# Patient Record
Sex: Female | Born: 1945
Health system: Southern US, Community
[De-identification: ages and names within clinical notes are randomized; demographics above are authoritative.]

## PROBLEM LIST (undated history)

## (undated) DIAGNOSIS — I639 Cerebral infarction, unspecified: Secondary | ICD-10-CM

## (undated) DIAGNOSIS — K219 Gastro-esophageal reflux disease without esophagitis: Secondary | ICD-10-CM

## (undated) HISTORY — DX: Gastro-esophageal reflux disease without esophagitis: K21.9

## (undated) HISTORY — DX: Cerebral infarction, unspecified: I63.9

---

## 1982-06-04 HISTORY — PX: ABDOMINAL HYSTERECTOMY: SHX81

## 1999-06-12 ENCOUNTER — Ambulatory Visit (HOSPITAL_COMMUNITY): Admission: RE | Admit: 1999-06-12 | Discharge: 1999-06-12 | Payer: Self-pay | Admitting: Internal Medicine

## 1999-06-12 ENCOUNTER — Encounter: Payer: Self-pay | Admitting: Internal Medicine

## 1999-07-15 ENCOUNTER — Emergency Department (HOSPITAL_COMMUNITY): Admission: EM | Admit: 1999-07-15 | Discharge: 1999-07-15 | Payer: Self-pay | Admitting: Emergency Medicine

## 1999-08-02 ENCOUNTER — Emergency Department (HOSPITAL_COMMUNITY): Admission: EM | Admit: 1999-08-02 | Discharge: 1999-08-02 | Payer: Self-pay | Admitting: Emergency Medicine

## 2003-01-11 ENCOUNTER — Other Ambulatory Visit: Admission: RE | Admit: 2003-01-11 | Discharge: 2003-01-11 | Payer: Self-pay | Admitting: Internal Medicine

## 2003-01-11 ENCOUNTER — Ambulatory Visit (HOSPITAL_COMMUNITY): Admission: RE | Admit: 2003-01-11 | Discharge: 2003-01-11 | Payer: Self-pay | Admitting: Internal Medicine

## 2003-01-11 ENCOUNTER — Encounter: Payer: Self-pay | Admitting: Internal Medicine

## 2004-02-08 ENCOUNTER — Encounter: Payer: Self-pay | Admitting: Internal Medicine

## 2005-07-02 ENCOUNTER — Ambulatory Visit: Payer: Self-pay | Admitting: Internal Medicine

## 2005-07-09 ENCOUNTER — Ambulatory Visit: Payer: Self-pay | Admitting: Internal Medicine

## 2005-07-10 ENCOUNTER — Ambulatory Visit (HOSPITAL_COMMUNITY): Admission: RE | Admit: 2005-07-10 | Discharge: 2005-07-10 | Payer: Self-pay | Admitting: Internal Medicine

## 2006-12-30 DIAGNOSIS — M8580 Other specified disorders of bone density and structure, unspecified site: Secondary | ICD-10-CM

## 2006-12-30 DIAGNOSIS — M899 Disorder of bone, unspecified: Secondary | ICD-10-CM

## 2006-12-30 DIAGNOSIS — J309 Allergic rhinitis, unspecified: Secondary | ICD-10-CM | POA: Insufficient documentation

## 2006-12-30 DIAGNOSIS — K219 Gastro-esophageal reflux disease without esophagitis: Secondary | ICD-10-CM

## 2006-12-30 DIAGNOSIS — M949 Disorder of cartilage, unspecified: Secondary | ICD-10-CM

## 2006-12-30 HISTORY — DX: Gastro-esophageal reflux disease without esophagitis: K21.9

## 2006-12-30 HISTORY — DX: Other specified disorders of bone density and structure, unspecified site: M85.80

## 2007-07-07 ENCOUNTER — Ambulatory Visit: Payer: Self-pay | Admitting: Internal Medicine

## 2007-07-07 DIAGNOSIS — G4700 Insomnia, unspecified: Secondary | ICD-10-CM

## 2007-07-07 DIAGNOSIS — I8390 Asymptomatic varicose veins of unspecified lower extremity: Secondary | ICD-10-CM

## 2007-07-10 ENCOUNTER — Encounter: Payer: Self-pay | Admitting: Internal Medicine

## 2007-07-14 ENCOUNTER — Ambulatory Visit: Payer: Self-pay | Admitting: Internal Medicine

## 2007-07-14 DIAGNOSIS — K921 Melena: Secondary | ICD-10-CM | POA: Insufficient documentation

## 2007-07-16 ENCOUNTER — Encounter: Payer: Self-pay | Admitting: Internal Medicine

## 2007-07-28 ENCOUNTER — Ambulatory Visit: Payer: Self-pay | Admitting: Internal Medicine

## 2007-07-28 DIAGNOSIS — E782 Mixed hyperlipidemia: Secondary | ICD-10-CM

## 2007-07-28 DIAGNOSIS — E559 Vitamin D deficiency, unspecified: Secondary | ICD-10-CM

## 2007-07-28 HISTORY — DX: Vitamin D deficiency, unspecified: E55.9

## 2007-07-28 HISTORY — DX: Mixed hyperlipidemia: E78.2

## 2007-08-25 ENCOUNTER — Ambulatory Visit (HOSPITAL_COMMUNITY): Admission: RE | Admit: 2007-08-25 | Discharge: 2007-08-25 | Payer: Self-pay | Admitting: Internal Medicine

## 2007-09-15 ENCOUNTER — Ambulatory Visit: Payer: Self-pay | Admitting: Internal Medicine

## 2007-09-15 LAB — CONVERTED CEMR LAB
ALT: 15 units/L (ref 0–35)
AST: 18 units/L (ref 0–37)
Albumin: 3.6 g/dL (ref 3.5–5.2)
Cholesterol: 199 mg/dL (ref 0–200)
HDL: 55.8 mg/dL (ref >39.0)
Total Protein: 6.4 g/dL (ref 6.0–8.3)
Triglycerides: 122 mg/dL (ref 0–149)
VLDL: 24 mg/dL (ref 0–40)

## 2007-09-22 ENCOUNTER — Ambulatory Visit: Payer: Self-pay | Admitting: Internal Medicine

## 2007-09-22 LAB — CONVERTED CEMR LAB
Cholesterol, target level: 200 mg/dL
LDL Goal: 160 mg/dL

## 2008-02-12 ENCOUNTER — Telehealth: Payer: Self-pay | Admitting: Internal Medicine

## 2008-03-26 ENCOUNTER — Telehealth: Payer: Self-pay | Admitting: Internal Medicine

## 2008-05-26 ENCOUNTER — Telehealth: Payer: Self-pay | Admitting: Internal Medicine

## 2008-07-18 ENCOUNTER — Emergency Department (HOSPITAL_COMMUNITY): Admission: EM | Admit: 2008-07-18 | Discharge: 2008-07-18 | Payer: Self-pay | Admitting: Emergency Medicine

## 2008-10-14 ENCOUNTER — Encounter: Payer: Self-pay | Admitting: Internal Medicine

## 2008-10-19 ENCOUNTER — Telehealth: Payer: Self-pay | Admitting: Internal Medicine

## 2008-10-22 ENCOUNTER — Ambulatory Visit (HOSPITAL_COMMUNITY): Admission: RE | Admit: 2008-10-22 | Discharge: 2008-10-22 | Payer: Self-pay | Admitting: Internal Medicine

## 2008-11-17 ENCOUNTER — Telehealth: Payer: Self-pay | Admitting: Internal Medicine

## 2008-11-25 ENCOUNTER — Telehealth: Payer: Self-pay | Admitting: *Deleted

## 2008-12-20 ENCOUNTER — Ambulatory Visit: Payer: Self-pay | Admitting: Internal Medicine

## 2008-12-20 DIAGNOSIS — M79609 Pain in unspecified limb: Secondary | ICD-10-CM

## 2008-12-20 DIAGNOSIS — T50995A Adverse effect of other drugs, medicaments and biological substances, initial encounter: Secondary | ICD-10-CM

## 2008-12-20 DIAGNOSIS — S92919A Unspecified fracture of unspecified toe(s), initial encounter for closed fracture: Secondary | ICD-10-CM

## 2008-12-20 LAB — CONVERTED CEMR LAB
Bilirubin Urine: NEGATIVE
Nitrite: NEGATIVE
Protein, U semiquant: NEGATIVE
Urobilinogen, UA: 0.2
Vit D, 25-Hydroxy: 39 ng/mL (ref 30–89)

## 2008-12-27 LAB — CONVERTED CEMR LAB
ALT: 18 units/L (ref 0–35)
Albumin: 4.1 g/dL (ref 3.5–5.2)
Alkaline Phosphatase: 44 units/L (ref 39–117)
Basophils Relative: 0.9 % (ref 0.0–3.0)
Bilirubin, Direct: 0 mg/dL (ref 0.0–0.3)
CO2: 31 meq/L (ref 19–32)
Calcium: 9.8 mg/dL (ref 8.4–10.5)
Chloride: 109 meq/L (ref 96–112)
Creatinine, Ser: 0.8 mg/dL (ref 0.4–1.2)
Eosinophils Absolute: 0.2 10*3/uL (ref 0.0–0.7)
Eosinophils Relative: 2.9 % (ref 0.0–5.0)
Glucose, Bld: 78 mg/dL (ref 70–99)
HCT: 42.5 % (ref 36.0–46.0)
Lymphs Abs: 1.3 10*3/uL (ref 0.7–4.0)
MCHC: 33.9 g/dL (ref 30.0–36.0)
MCV: 91.6 fL (ref 78.0–100.0)
Monocytes Absolute: 0.3 10*3/uL (ref 0.1–1.0)
Neutro Abs: 3.7 10*3/uL (ref 1.4–7.7)
RBC: 4.64 M/uL (ref 3.87–5.11)
Total Protein: 7.3 g/dL (ref 6.0–8.3)
WBC: 5.6 10*3/uL (ref 4.5–10.5)

## 2009-01-06 ENCOUNTER — Telehealth: Payer: Self-pay | Admitting: Internal Medicine

## 2009-02-03 ENCOUNTER — Ambulatory Visit: Payer: Self-pay | Admitting: Internal Medicine

## 2009-02-16 ENCOUNTER — Telehealth: Payer: Self-pay | Admitting: *Deleted

## 2009-04-06 ENCOUNTER — Telehealth: Payer: Self-pay | Admitting: *Deleted

## 2009-08-19 ENCOUNTER — Telehealth (INDEPENDENT_AMBULATORY_CARE_PROVIDER_SITE_OTHER): Payer: Self-pay | Admitting: *Deleted

## 2009-12-08 ENCOUNTER — Telehealth: Payer: Self-pay | Admitting: Internal Medicine

## 2010-03-14 ENCOUNTER — Telehealth: Payer: Self-pay | Admitting: *Deleted

## 2010-04-18 ENCOUNTER — Encounter: Payer: Self-pay | Admitting: Internal Medicine

## 2010-04-20 ENCOUNTER — Encounter: Payer: Self-pay | Admitting: *Deleted

## 2010-07-02 LAB — CONVERTED CEMR LAB
ALT: 14 units/L (ref 0–35)
Albumin: 3.8 g/dL (ref 3.5–5.2)
Alkaline Phosphatase: 43 units/L (ref 39–117)
BUN: 10 mg/dL (ref 6–23)
Basophils Absolute: 0 10*3/uL (ref 0.0–0.1)
Basophils Relative: 0.2 % (ref 0.0–1.0)
Bilirubin Urine: NEGATIVE
Blood in Urine, dipstick: NEGATIVE
CO2: 32 meq/L (ref 19–32)
Calcium: 10.1 mg/dL (ref 8.4–10.5)
Cholesterol: 305 mg/dL (ref 0–200)
GFR calc Af Amer: 82 mL/min
GFR calc non Af Amer: 68 mL/min
Glucose, Urine, Semiquant: NEGATIVE
HDL: 60.3 mg/dL (ref >39.0)
Ketones, urine, test strip: NEGATIVE
Lymphocytes Relative: 31 % (ref 12.0–46.0)
MCHC: 33.1 g/dL (ref 30.0–36.0)
Monocytes Relative: 10.4 % (ref 3.0–11.0)
Neutro Abs: 2.4 10*3/uL (ref 1.4–7.7)
Nitrite: NEGATIVE
Pap Smear: NORMAL
Platelets: 257 10*3/uL (ref 150–400)
Potassium: 4.5 meq/L (ref 3.5–5.1)
Protein, U semiquant: NEGATIVE
Specific Gravity, Urine: 1.02
TSH: 1.06 microintl units/mL (ref 0.35–5.50)
Total CHOL/HDL Ratio: 5.1
Total Protein: 6.7 g/dL (ref 6.0–8.3)
Urobilinogen, UA: 0.2
VLDL: 27 mg/dL (ref 0–40)
Vit D, 1,25-Dihydroxy: 26 — ABNORMAL LOW (ref 30–89)
WBC Urine, dipstick: NEGATIVE
pH: 5.5

## 2010-07-04 NOTE — Miscellaneous (Signed)
Summary: Flu Shot/[facility]  Flu Shot/[facility]   Imported By: Maryln Gottron 04/25/2010 10:09:38  _____________________________________________________________________  External Attachment:    Type:   Image     Comment:   External Document

## 2010-07-04 NOTE — Progress Notes (Signed)
Summary: refill  Phone Note From Pharmacy   Summary of Call: Refill Zolpidem 10 mjg. #30 NO refil [facility] [place] [address] [place]. Initial call taken by: Lynann Beaver CMA,  August 19, 2009 5:00 PM

## 2010-07-04 NOTE — Progress Notes (Signed)
Summary: refill zolpidem  Phone Note Refill Request Message from:  Fax from Pharmacy on December 08, 2009 10:46 AM  Refills Requested: Medication #1:  AMBIEN 10 MG TABS 1 by mouth once daily   Dosage confirmed as above?Dosage Confirmed   Supply Requested: 1 month   Last Refilled: 03/18/  Method Requested: Fax to Local Pharmacy Initial call taken by: Raechel Ache, RN,  December 08, 2009 10:47 AM Caller: [facility] [id number]*  Follow-up for Phone Call        ok x 1  Follow-up by: Madelin Headings MD,  December 08, 2009 12:49 PM    Prescriptions: AMBIEN 10 MG TABS (ZOLPIDEM TARTRATE) 1 by mouth once daily  #30 x 0   Entered by:   Raechel Ache, RN   Authorized by:   Madelin Headings MD   Signed by:   Raechel Ache, RN on 12/08/2009   Method used:   Historical   RxID:   [id number]

## 2010-07-04 NOTE — Miscellaneous (Signed)
Summary: Immunization Entry   Immunization History:  Influenza Immunization History:    Influenza:  fluvax 3+ (04/18/2010)

## 2010-07-04 NOTE — Progress Notes (Signed)
Summary: refill on zolpidem 10mg   Phone Note From Pharmacy   Caller: [facility] [address] [phone]* Reason for Call: Needs renewal Details for Reason: zolpidem 10mg  Summary of Call: last filled on 12/13/09 #30 Initial call taken by: Romualdo Bolk, CMA (AAMA),  March 14, 2010 11:07 AM  Follow-up for Phone Call        i  havent seen her in over a year she needs to either have an ov   of some plan for follow up .  Follow-up by: Madelin Headings MD,  March 15, 2010 9:57 AM  Additional Follow-up for Phone Call Additional follow up Details #1::        [facility] Additional Follow-up by: Romualdo Bolk, CMA Duncan Dull),  March 15, 2010 1:49 PM    Additional Follow-up for Phone Call Additional follow up Details #2::    Spoke with pt and she doesn't need this rx. Rx faxed back as denied. Follow-up by: Romualdo Bolk, CMA Duncan Dull),  March 15, 2010 2:41 PM   Appended Document: refill on zolpidem 10mg  Pt states that she doesn't have ins right now. Her company cut it out but will try to schedule an appt when she can.

## 2013-06-09 ENCOUNTER — Telehealth: Payer: Self-pay | Admitting: Internal Medicine

## 2013-06-09 NOTE — Telephone Encounter (Signed)
Pt has not been seen in about 5 yrs. Pt has medicare now. Can I sch?

## 2013-06-10 NOTE — Telephone Encounter (Signed)
No ans

## 2013-06-10 NOTE — Telephone Encounter (Signed)
Not currently taking new medicare ( very backed up) perhaps dr Skeet Latch could see her if she is willing.

## 2013-06-12 NOTE — Telephone Encounter (Signed)
Pt is aware.  

## 2013-07-27 ENCOUNTER — Encounter: Payer: Self-pay | Admitting: Physician Assistant

## 2013-07-27 ENCOUNTER — Ambulatory Visit (INDEPENDENT_AMBULATORY_CARE_PROVIDER_SITE_OTHER): Payer: Medicare HMO | Admitting: Physician Assistant

## 2013-07-27 VITALS — BP 120/80 | HR 93 | Temp 98.0°F | Ht 63.0 in | Wt 127.3 lb

## 2013-07-27 DIAGNOSIS — K219 Gastro-esophageal reflux disease without esophagitis: Secondary | ICD-10-CM

## 2013-07-27 DIAGNOSIS — G4700 Insomnia, unspecified: Secondary | ICD-10-CM

## 2013-07-27 DIAGNOSIS — Z1211 Encounter for screening for malignant neoplasm of colon: Secondary | ICD-10-CM

## 2013-07-27 DIAGNOSIS — Z0000 Encounter for general adult medical examination without abnormal findings: Secondary | ICD-10-CM

## 2013-07-27 DIAGNOSIS — Z136 Encounter for screening for cardiovascular disorders: Secondary | ICD-10-CM

## 2013-07-27 DIAGNOSIS — Z1231 Encounter for screening mammogram for malignant neoplasm of breast: Secondary | ICD-10-CM

## 2013-07-27 NOTE — Progress Notes (Signed)
Pre-visit discussion using our clinic review tool. No additional management support is needed unless otherwise documented below in the visit note.  

## 2013-07-27 NOTE — Patient Instructions (Signed)
It was great to meet you today Gina King!   Labs have been ordered for you, when you report to lab please be fasting.    Health Maintenance, Female A healthy lifestyle and preventative care can promote health and wellness.  Maintain regular health, dental, and eye exams.  Eat a healthy diet. Foods like vegetables, fruits, whole grains, low-fat dairy products, and lean protein foods contain the nutrients you need without too many calories. Decrease your intake of foods high in solid fats, added sugars, and salt. Get information about a proper diet from your caregiver, if necessary.  Regular physical exercise is one of the most important things you can do for your health. Most adults should get at least 150 minutes of moderate-intensity exercise (any activity that increases your heart rate and causes you to sweat) each week. In addition, most adults need muscle-strengthening exercises on 2 or more days a week.   Maintain a healthy weight. The body mass index (BMI) is a screening tool to identify possible weight problems. It provides an estimate of body fat based on height and weight. Your caregiver can help determine your BMI, and can help you achieve or maintain a healthy weight. For adults 20 years and older:  A BMI below 18.5 is considered underweight.  A BMI of 18.5 to 24.9 is normal.  A BMI of 25 to 29.9 is considered overweight.  A BMI of 30 and above is considered obese.  Maintain normal blood lipids and cholesterol by exercising and minimizing your intake of saturated fat. Eat a balanced diet with plenty of fruits and vegetables. Blood tests for lipids and cholesterol should begin at age 69 and be repeated every 5 years. If your lipid or cholesterol levels are high, you are over 50, or you are a high risk for heart disease, you may need your cholesterol levels checked more frequently.Ongoing high lipid and cholesterol levels should be treated with medicines if diet and exercise are  not effective.  If you smoke, find out from your caregiver how to quit. If you do not use tobacco, do not start.  Lung cancer screening is recommended for adults aged 81 80 years who are at high risk for developing lung cancer because of a history of smoking. Yearly low-dose computed tomography (CT) is recommended for people who have at least a 30-pack-year history of smoking and are a current smoker or have quit within the past 15 years. A pack year of smoking is smoking an average of 1 pack of cigarettes a day for 1 year (for example: 1 pack a day for 30 years or 2 packs a day for 15 years). Yearly screening should continue until the smoker has stopped smoking for at least 15 years. Yearly screening should also be stopped for people who develop a health problem that would prevent them from having lung cancer treatment.  If you are pregnant, do not drink alcohol. If you are breastfeeding, be very cautious about drinking alcohol. If you are not pregnant and choose to drink alcohol, do not exceed 1 drink per day. One drink is considered to be 12 ounces (355 mL) of beer, 5 ounces (148 mL) of wine, or 1.5 ounces (44 mL) of liquor.  Avoid use of street drugs. Do not share needles with anyone. Ask for help if you need support or instructions about stopping the use of drugs.  High blood pressure causes heart disease and increases the risk of stroke. Blood pressure should be checked at least  every 1 to 2 years. Ongoing high blood pressure should be treated with medicines, if weight loss and exercise are not effective.  If you are 35 to 66 years old, ask your caregiver if you should take aspirin to prevent strokes.  Diabetes screening involves taking a blood sample to check your fasting blood sugar level. This should be done once every 3 years, after age 34, if you are within normal weight and without risk factors for diabetes. Testing should be considered at a younger age or be carried out more frequently if  you are overweight and have at least 1 risk factor for diabetes.  Breast cancer screening is essential preventative care for women. You should practice "breast self-awareness." This means understanding the normal appearance and feel of your breasts and may include breast self-examination. Any changes detected, no matter how small, should be reported to a caregiver. Women in their 61s and 30s should have a clinical breast exam (CBE) by a caregiver as part of a regular health exam every 1 to 3 years. After age 40, women should have a CBE every year. Starting at age 62, women should consider having a mammogram (breast X-ray) every year. Women who have a family history of breast cancer should talk to their caregiver about genetic screening. Women at a high risk of breast cancer should talk to their caregiver about having an MRI and a mammogram every year.  Breast cancer gene (BRCA)-related cancer risk assessment is recommended for women who have family members with BRCA-related cancers. BRCA-related cancers include breast, ovarian, tubal, and peritoneal cancers. Having family members with these cancers may be associated with an increased risk for harmful changes (mutations) in the breast cancer genes BRCA1 and BRCA2. Results of the assessment will determine the need for genetic counseling and BRCA1 and BRCA2 testing.  The Pap test is a screening test for cervical cancer. Women should have a Pap test starting at age 80. Between ages 28 and 21, Pap tests should be repeated every 2 years. Beginning at age 46, you should have a Pap test every 3 years as long as the past 3 Pap tests have been normal. If you had a hysterectomy for a problem that was not cancer or a condition that could lead to cancer, then you no longer need Pap tests. If you are between ages 34 and 77, and you have had normal Pap tests going back 10 years, you no longer need Pap tests. If you have had past treatment for cervical cancer or a condition  that could lead to cancer, you need Pap tests and screening for cancer for at least 20 years after your treatment. If Pap tests have been discontinued, risk factors (such as a new sexual partner) need to be reassessed to determine if screening should be resumed. Some women have medical problems that increase the chance of getting cervical cancer. In these cases, your caregiver may recommend more frequent screening and Pap tests.  The human papillomavirus (HPV) test is an additional test that may be used for cervical cancer screening. The HPV test looks for the virus that can cause the cell changes on the cervix. The cells collected during the Pap test can be tested for HPV. The HPV test could be used to screen women aged 39 years and older, and should be used in women of any age who have unclear Pap test results. After the age of 61, women should have HPV testing at the same frequency as a Pap test.  Colorectal cancer can be detected and often prevented. Most routine colorectal cancer screening begins at the age of 2 and continues through age 66. However, your caregiver may recommend screening at an earlier age if you have risk factors for colon cancer. On a yearly basis, your caregiver may provide home test kits to check for hidden blood in the stool. Use of a small camera at the end of a tube, to directly examine the colon (sigmoidoscopy or colonoscopy), can detect the earliest forms of colorectal cancer. Talk to your caregiver about this at age 75, when routine screening begins. Direct examination of the colon should be repeated every 5 to 10 years through age 22, unless early forms of pre-cancerous polyps or small growths are found.  Hepatitis C blood testing is recommended for all people born from [birth year] through 1963 and any individual with known risks for hepatitis C.  Practice safe sex. Use condoms and avoid high-risk sexual practices to reduce the spread of sexually transmitted infections (STIs).  Sexually active women aged 61 and younger should be checked for Chlamydia, which is a common sexually transmitted infection. Older women with new or multiple partners should also be tested for Chlamydia. Testing for other STIs is recommended if you are sexually active and at increased risk.  Osteoporosis is a disease in which the bones lose minerals and strength with aging. This can result in serious bone fractures. The risk of osteoporosis can be identified using a bone density scan. Women ages 84 and over and women at risk for fractures or osteoporosis should discuss screening with their caregivers. Ask your caregiver whether you should be taking a calcium supplement or vitamin D to reduce the rate of osteoporosis.  Menopause can be associated with physical symptoms and risks. Hormone replacement therapy is available to decrease symptoms and risks. You should talk to your caregiver about whether hormone replacement therapy is right for you.  Use sunscreen. Apply sunscreen liberally and repeatedly throughout the day. You should seek shade when your shadow is shorter than you. Protect yourself by wearing long sleeves, pants, a wide-brimmed hat, and sunglasses year round, whenever you are outdoors.  Notify your caregiver of new moles or changes in moles, especially if there is a change in shape or color. Also notify your caregiver if a mole is larger than the size of a pencil eraser.  Stay current with your immunizations. Document Released: 12/04/2010 Document Revised: 09/15/2012 Document Reviewed: 12/04/2010 [facility] Patient Information 2014 [place].

## 2013-07-27 NOTE — Progress Notes (Signed)
Patient ID: Gina King is a 68 y.o. female DOB: [phone] MRN: [id number]     HPI:  Patient is a 68 year old female who presents to the office to establish care and have physical exam and labs done. Patient reports has been seen by a [place] provider in past however has been more than 3 years since last visit. Patient reports she is in generally good health. Reports history of GERD well controlled with OTC ranitidine. Reports no other current issues at this time. Reports other medications include Vitamin C, D and a multivitamin. History of insomnia well controlled with Sominex. Denies chest pain/palpitations, SOB, cough, N/V/F/C, visual change/disturbance, change in bowel/bladder habits, pain/difficulty swallowing, lightheaded, dizziness, numbness, tingling or weakness.   Influenza: 10/14 Pneumonia: no Tetanus:7/10 PAP: can't remember LMP: 30 or so years, hysterectomy, not certain if partial or complete. Mammogram: 10/2008 Colonoscopy: more than 10 years ago   ROS: As stated in HPI. All other systems negative  Past Medical History  Diagnosis Date  . GERD (gastroesophageal reflux disease)    Family History  Problem Relation Age of Onset  . Kidney failure Brother    History   Social History  . Marital Status: Married    Spouse Name: N/A    Number of Children: 2  . Years of Education: 10 1/2 yrs   Occupational History  .     Social History Main Topics  . Smoking status: Never Smoker   . Smokeless tobacco: Never Used  . Alcohol Use: No  . Drug Use: No  . Sexual Activity: None   Other Topics Concern  . None   Social History Narrative  . None   Past Surgical History  Procedure Laterality Date  . Abdominal hysterectomy  1984   No current outpatient prescriptions on file prior to visit.   No current facility-administered medications on file prior to visit.   Allergies  Allergen Reactions  . Gabapentin     REACTION: withdrawal symtoms when didnt wean     PE: CONSTITUTIONAL: Well developed, well nourished, pleasant, appears stated age, in NAD HEENT: normocephalic, atraumatic, bilateral ext/int canals normal. Bilateral TM's without injections, bulging, erythema. Nose normal, uvula midline, oropharynx clear and moist. EYES: PERRLA, bilateral EOM and conjunctiva normal NECK: FROM, supple, without thyromegaly or mass, no carotid bruits. CARDIO: RRR, normal S1 and S2, distal pulses intact. PULM/CHEST CTA bilateral, no wheezes, rales or rhonchi. Non tender. ABD: appearance normal, soft, nontender. Normal bowel sounds x 4 quadrants, non palpable liver, kidney or spleen.  GU: deferred to GYN MUSC: FROM U/LE bilateral, FROM of thoracic and lumbar spine LYMPH: no cervical, supraclavicular adenopathy NEURO: alert and oriented x 3, no cranial nerve deficit, motor strength and coordination NL. Negative romberg. Gait normal. DTR's intact. SKIN: warm, dry, no rash or lesions noted. PSYCH: Mood and affect normal, speech normal.   Lab Results  Component Value Date   WBC 5.6 12/20/2008   HGB 14.4 12/20/2008   HCT 42.5 12/20/2008   PLT 232.0 12/20/2008   GLUCOSE 78 12/20/2008   CHOL 258* 12/20/2008   TRIG 142.0 12/20/2008   HDL 62.50 12/20/2008   LDLDIRECT 172.2 12/20/2008   LDLCALC 119* 09/15/2007   ALT 18 12/20/2008   AST 24 12/20/2008   NA 145 12/20/2008   K 4.5 12/20/2008   CL 109 12/20/2008   CREATININE 0.8 12/20/2008   BUN 9 12/20/2008   CO2 31 12/20/2008   TSH 0.65 12/20/2008    Filed Vitals:  07/27/13 1534  BP: 120/80  Pulse: 93  Temp: 98 F (36.7 C)    ECG: today sinus rhythm, rate 83, no acute findings  ASSESSMENT and PLAN   CPX/v70.0 - Patient has been counseled on age-appropriate routine health concerns for screening and prevention. These are reviewed and up-to-date. Immunizations are up-to-date or declined. Labs ordered and ECG reviewed.    Insomnia: Well controlled with sominex 25 mg at bedtime.   GERD: Well controlled   Continue taking Zantac 150 mg one daily

## 2013-08-13 ENCOUNTER — Other Ambulatory Visit (INDEPENDENT_AMBULATORY_CARE_PROVIDER_SITE_OTHER): Payer: Medicare HMO

## 2013-08-13 ENCOUNTER — Other Ambulatory Visit: Payer: Self-pay | Admitting: Physician Assistant

## 2013-08-13 DIAGNOSIS — Z0000 Encounter for general adult medical examination without abnormal findings: Secondary | ICD-10-CM

## 2013-08-13 DIAGNOSIS — E785 Hyperlipidemia, unspecified: Secondary | ICD-10-CM

## 2013-08-13 LAB — CBC WITH DIFFERENTIAL/PLATELET
BASOS PCT: 0.8 % (ref 0.0–3.0)
Basophils Absolute: 0 10*3/uL (ref 0.0–0.1)
EOS ABS: 0.2 10*3/uL (ref 0.0–0.7)
EOS PCT: 3.5 % (ref 0.0–5.0)
HCT: 42.4 % (ref 36.0–46.0)
Hemoglobin: 14 g/dL (ref 12.0–15.0)
LYMPHS PCT: 35.1 % (ref 12.0–46.0)
Lymphs Abs: 1.7 10*3/uL (ref 0.7–4.0)
MCHC: 33 g/dL (ref 30.0–36.0)
MCV: 92.8 fl (ref 78.0–100.0)
Monocytes Absolute: 0.5 10*3/uL (ref 0.1–1.0)
Monocytes Relative: 10 % (ref 3.0–12.0)
Neutro Abs: 2.4 10*3/uL (ref 1.4–7.7)
Neutrophils Relative %: 50.6 % (ref 43.0–77.0)
PLATELETS: 281 10*3/uL (ref 150.0–400.0)
RBC: 4.57 Mil/uL (ref 3.87–5.11)
RDW: 13.8 % (ref 11.5–14.6)
WBC: 4.8 10*3/uL (ref 4.5–10.5)

## 2013-08-13 LAB — URINALYSIS, ROUTINE W REFLEX MICROSCOPIC
Bilirubin Urine: NEGATIVE
Hgb urine dipstick: NEGATIVE
Ketones, ur: NEGATIVE
LEUKOCYTES UA: NEGATIVE
Nitrite: NEGATIVE
Total Protein, Urine: NEGATIVE
URINE GLUCOSE: NEGATIVE
Urobilinogen, UA: 0.2 (ref 0.0–1.0)
WBC UA: NONE SEEN (ref 0–?)
pH: 6.5 (ref 5.0–8.0)

## 2013-08-13 LAB — BASIC METABOLIC PANEL
BUN: 10 mg/dL (ref 6–23)
CHLORIDE: 104 meq/L (ref 96–112)
CO2: 31 meq/L (ref 19–32)
Calcium: 9.2 mg/dL (ref 8.4–10.5)
Creatinine, Ser: 0.7 mg/dL (ref 0.4–1.2)
GFR: 95.41 mL/min (ref >60.00)
Glucose, Bld: 89 mg/dL (ref 70–99)
Potassium: 4.5 mEq/L (ref 3.5–5.1)
Sodium: 139 mEq/L (ref 135–145)

## 2013-08-13 LAB — LIPID PANEL
Cholesterol: 263 mg/dL — ABNORMAL HIGH (ref 0–200)
HDL: 69.8 mg/dL (ref >39.00)
LDL CALC: 171 mg/dL — AB (ref 0–99)
Total CHOL/HDL Ratio: 4
Triglycerides: 110 mg/dL (ref 0.0–149.0)
VLDL: 22 mg/dL (ref 0.0–40.0)

## 2013-08-13 LAB — HEPATIC FUNCTION PANEL
ALBUMIN: 3.8 g/dL (ref 3.5–5.2)
ALK PHOS: 53 U/L (ref 39–117)
ALT: 15 U/L (ref 0–35)
AST: 20 U/L (ref 0–37)
BILIRUBIN DIRECT: 0.1 mg/dL (ref 0.0–0.3)
BILIRUBIN TOTAL: 0.7 mg/dL (ref 0.3–1.2)
Total Protein: 6.7 g/dL (ref 6.0–8.3)

## 2013-08-13 LAB — TSH: TSH: 1.72 u[IU]/mL (ref 0.35–5.50)

## 2013-08-13 MED ORDER — ATORVASTATIN CALCIUM 20 MG PO TABS: 20.0000 mg | ORAL_TABLET | Freq: Every day | ORAL | Status: DC

## 2013-08-14 ENCOUNTER — Telehealth: Payer: Self-pay | Admitting: *Deleted

## 2013-08-14 ENCOUNTER — Encounter: Payer: Self-pay | Admitting: Physician Assistant

## 2013-08-14 ENCOUNTER — Ambulatory Visit (HOSPITAL_COMMUNITY)
Admission: RE | Admit: 2013-08-14 | Discharge: 2013-08-14 | Disposition: A | Discharge status: Home / Self Care | Payer: Medicare HMO | Source: Ambulatory Visit | Attending: Physician Assistant | Admitting: Physician Assistant

## 2013-08-14 ENCOUNTER — Other Ambulatory Visit: Payer: Self-pay | Admitting: *Deleted

## 2013-08-14 DIAGNOSIS — Z1231 Encounter for screening mammogram for malignant neoplasm of breast: Secondary | ICD-10-CM | POA: Insufficient documentation

## 2013-08-14 DIAGNOSIS — Z0000 Encounter for general adult medical examination without abnormal findings: Secondary | ICD-10-CM

## 2013-08-14 DIAGNOSIS — Z1211 Encounter for screening for malignant neoplasm of colon: Secondary | ICD-10-CM

## 2013-08-14 NOTE — Telephone Encounter (Signed)
Phoned patient to confirm pharmacy preference for submitting newly prescribed Lipitor.  Patient knew nothing about the need for a cholesterol med (had no idea about lab results & no notes to refer to in EMR).  Patient asked that I not submit script order to her pharmacy at this time until she could f/u with Gina King.

## 2013-08-15 ENCOUNTER — Other Ambulatory Visit: Payer: Self-pay | Admitting: *Deleted

## 2013-08-15 MED ORDER — RANITIDINE HCL 150 MG PO TABS: 150.0000 mg | ORAL_TABLET | Freq: Every day | ORAL | Status: DC

## 2013-08-15 MED ORDER — ATORVASTATIN CALCIUM 20 MG PO TABS: 20.0000 mg | ORAL_TABLET | Freq: Every day | ORAL | Status: DC

## 2013-08-15 NOTE — Telephone Encounter (Signed)
Sent email requesting [facility]...Johny Chess

## 2013-08-25 ENCOUNTER — Encounter: Payer: Self-pay | Admitting: Physician Assistant

## 2014-02-22 ENCOUNTER — Encounter: Payer: Self-pay | Admitting: Internal Medicine

## 2014-02-22 ENCOUNTER — Encounter: Payer: Self-pay | Admitting: Gastroenterology

## 2014-02-22 ENCOUNTER — Ambulatory Visit (INDEPENDENT_AMBULATORY_CARE_PROVIDER_SITE_OTHER): Payer: Medicare HMO | Admitting: Internal Medicine

## 2014-02-22 VITALS — BP 132/64 | HR 98 | Temp 98.1°F | Resp 18 | Ht 63.0 in | Wt 131.8 lb

## 2014-02-22 DIAGNOSIS — E782 Mixed hyperlipidemia: Secondary | ICD-10-CM

## 2014-02-22 DIAGNOSIS — Z1211 Encounter for screening for malignant neoplasm of colon: Secondary | ICD-10-CM

## 2014-02-22 DIAGNOSIS — M899 Disorder of bone, unspecified: Secondary | ICD-10-CM

## 2014-02-22 DIAGNOSIS — M949 Disorder of cartilage, unspecified: Secondary | ICD-10-CM

## 2014-02-22 DIAGNOSIS — Z23 Encounter for immunization: Secondary | ICD-10-CM

## 2014-02-22 DIAGNOSIS — Z0001 Encounter for general adult medical examination with abnormal findings: Secondary | ICD-10-CM | POA: Insufficient documentation

## 2014-02-22 DIAGNOSIS — Z0000 Encounter for general adult medical examination without abnormal findings: Secondary | ICD-10-CM | POA: Insufficient documentation

## 2014-02-22 DIAGNOSIS — K219 Gastro-esophageal reflux disease without esophagitis: Secondary | ICD-10-CM

## 2014-02-22 NOTE — Assessment & Plan Note (Signed)
Will check to see if she has ever had DEXA, if not will order at next visit.

## 2014-02-22 NOTE — Progress Notes (Signed)
Pre visit review using our clinic review tool, if applicable. No additional management support is needed unless otherwise documented below in the visit note. 

## 2014-02-22 NOTE — Assessment & Plan Note (Signed)
Uncertain history and patient denies at this time. She was referred for colon cancer screening as she has never had.

## 2014-02-22 NOTE — Patient Instructions (Addendum)
We will give you the flu shot and the pneumonia shot today.   You can use a water based lubricant as needed to help add moisture. If you do not like using this we can prescribe an estrogen cream that you use daily to increase the natural lubrication.  We will get you set up to see the GI doctor and get your colon cancer screening. You can call your insurance company to see about getting the shingles shot and whether you should get it at our office or at a drug store.   Come back in about 1 year unless you are feeling sick call us sooner.  Exercise to Stay Healthy Exercise helps you become and stay healthy. EXERCISE IDEAS AND TIPS Choose exercises that:  You enjoy.  Fit into your day. You do not need to exercise really hard to be healthy. You can do exercises at a slow or medium level and stay healthy. You can:  Stretch before and after working out.  Try yoga, Pilates, or tai chi.  Lift weights.  Walk fast, swim, jog, run, climb stairs, bicycle, dance, or rollerskate.  Take aerobic classes. Exercises that burn about 150 calories:  Running 1  miles in 15 minutes.  Playing volleyball for 45 to 60 minutes.  Washing and waxing a car for 45 to 60 minutes.  Playing touch football for 45 minutes.  Walking 1  miles in 35 minutes.  Pushing a stroller 1  miles in 30 minutes.  Playing basketball for 30 minutes.  Raking leaves for 30 minutes.  Bicycling 5 miles in 30 minutes.  Walking 2 miles in 30 minutes.  Dancing for 30 minutes.  Shoveling snow for 15 minutes.  Swimming laps for 20 minutes.  Walking up stairs for 15 minutes.  Bicycling 4 miles in 15 minutes.  Gardening for 30 to 45 minutes.  Jumping rope for 15 minutes.  Washing windows or floors for 45 to 60 minutes. Document Released: 06/23/2010 Document Revised: 08/13/2011 Document Reviewed: 06/23/2010 [facility] Patient Information 2015 [place], [place]. This information is not intended to replace advice  given to you by your health care provider. Make sure you discuss any questions you have with your health care provider.

## 2014-02-22 NOTE — Progress Notes (Signed)
   Subjective:    Patient ID: Gina King, female    DOB: Sep 30, 1945, 68 y.o.   MRN: [id number]  HPI The patient is a 68 YO female who is coming in today to establish care. She has PMH of GERD, osteopenia. She is doing well at this time and her only concern is some vaginal dryness. She has noticed it some since her hysterectomy and it is not highly concerning to her but she would like it to improve. She denies chest pains, SOB, abdominal pains, diarrhea, constipation. She does use ranitidine for some acid reflux which she feels is well controlled. She got mammogram earlier this year and has never had colon cancer screening. She denies change in bowel movements or blood in stool.   Review of Systems  Constitutional: Negative for activity change, appetite change and fatigue.  HENT: Negative.   Eyes: Negative.   Respiratory: Negative for cough, chest tightness, shortness of breath and wheezing.   Cardiovascular: Negative for chest pain, palpitations and leg swelling.  Gastrointestinal: Negative for abdominal pain, diarrhea, constipation, blood in stool and abdominal distention.  Endocrine: Negative.   Genitourinary:       Vaginal dryness  Musculoskeletal: Negative for arthralgias, gait problem and myalgias.  Skin: Negative.   Neurological: Negative.       Objective:   Physical Exam  Vitals reviewed. Constitutional: She is oriented to person, place, and time. She appears well-developed and well-nourished. No distress.  HENT:  Head: Normocephalic and atraumatic.  Eyes: EOM are normal.  Neck: Normal range of motion. No JVD present.  Cardiovascular: Normal rate and regular rhythm.   Pulmonary/Chest: Effort normal and breath sounds normal. No respiratory distress. She has no wheezes. She has no rales.  Abdominal: Soft. Bowel sounds are normal. She exhibits no distension. There is no tenderness. There is no rebound.  Neurological: She is alert and oriented to person, place, and time.  Coordination normal.  Skin: Skin is warm and dry.   Filed Vitals:   02/22/14 1054  BP: 132/64  Pulse: 98  Temp: 98.1 F (36.7 C)  TempSrc: Oral  Resp: 18  Height: 5\' 3"  (1.6 m)  Weight: 131 lb 12.8 oz (59.784 kg)  SpO2: 95%      Assessment & Plan:  Flu shot and pneumonia shot given at today's visit.

## 2014-02-22 NOTE — Assessment & Plan Note (Signed)
Last LDL 171 and started on statin at that time. Tolerating well. Recheck lipid panel next visit.

## 2014-02-22 NOTE — Assessment & Plan Note (Signed)
Flu shot and pneumonia shot given. Colon cancer screening referral placed. Mammogram up to date. Advised to check with insurance about shingles shot coverage.

## 2014-02-22 NOTE — Assessment & Plan Note (Signed)
Patient uses ranitidine once daily and advised her that she can take it BID if still having some reflux.

## 2014-02-25 ENCOUNTER — Ambulatory Visit (AMBULATORY_SURGERY_CENTER): Payer: Self-pay

## 2014-02-25 VITALS — Ht 63.0 in | Wt 130.0 lb

## 2014-02-25 DIAGNOSIS — Z1211 Encounter for screening for malignant neoplasm of colon: Secondary | ICD-10-CM

## 2014-02-25 MED ORDER — MOVIPREP 100 G PO SOLR: 1.0000 | Freq: Once | ORAL | Status: DC

## 2014-02-25 NOTE — Progress Notes (Signed)
No allergies to eggs or soy No past problems with anesthesia No diet/weight loss meds No home oxygen  Has email  Emmi instructions given for colonscopy

## 2014-03-01 ENCOUNTER — Telehealth: Payer: Self-pay | Admitting: Gastroenterology

## 2014-03-01 NOTE — Telephone Encounter (Signed)
Pt states her prep with insurance was 85.00 and she cannot afford this and wanted to cancel her procedure. i told pt i could call [facility] and give them need info for a free kit per a coupon . Pt states she can do that, she did not cancel her procedure and she will go pick up movi prep.   Lenard Galloway, RN

## 2014-03-04 ENCOUNTER — Encounter: Payer: Self-pay | Admitting: Gastroenterology

## 2014-03-04 ENCOUNTER — Ambulatory Visit (AMBULATORY_SURGERY_CENTER): Payer: Medicare HMO | Admitting: Gastroenterology

## 2014-03-04 VITALS — BP 141/70 | HR 71 | Temp 97.6°F | Resp 12 | Ht 63.0 in | Wt 130.0 lb

## 2014-03-04 DIAGNOSIS — K635 Polyp of colon: Secondary | ICD-10-CM

## 2014-03-04 DIAGNOSIS — Z1211 Encounter for screening for malignant neoplasm of colon: Secondary | ICD-10-CM

## 2014-03-04 DIAGNOSIS — D125 Benign neoplasm of sigmoid colon: Secondary | ICD-10-CM

## 2014-03-04 MED ORDER — SODIUM CHLORIDE 0.9 % IV SOLN: 500.0000 mL | INTRAVENOUS | Status: DC

## 2014-03-04 NOTE — Op Note (Signed)
[place]  [address] [place], [id number]   COLONOSCOPY PROCEDURE REPORT  PATIENT: Gina King, Gina King  MR#: [id number] BIRTHDATE: May 07, 1946 , 89  yrs. old GENDER: female ENDOSCOPIST: Ladene Artist, MD, [facility] REFERRED [id number] Doug Sou, M.D. PROCEDURE DATE:  03/04/2014 PROCEDURE:   Colonoscopy with biopsy and Colonoscopy with snare polypectomy First Screening Colonoscopy - Avg.  risk and is 50 yrs.  old or older Yes.  Prior Negative Screening - Now for repeat screening. N/A  History of Adenoma - Now for follow-up colonoscopy & has been > or = to 3 yrs.  N/A  Polyps Removed Today? Yes. ASA CLASS:   Class II INDICATIONS:average risk for colorectal cancer. MEDICATIONS: Monitored anesthesia care and Propofol 300 mg IV DESCRIPTION OF PROCEDURE:   After the risks benefits and alternatives of the procedure were thoroughly explained, informed consent was obtained.  The digital rectal exam revealed no abnormalities of the rectum.   The LB [id number]  endoscope was introduced through the anus and advanced to the cecum, which was identified by both the appendix and ileocecal valve. No adverse events experienced.   The quality of the prep was good, using MoviPrep  The instrument was then slowly withdrawn as the colon was fully examined.  COLON FINDINGS: Three sessile polyps measuring 4, 5, 6 mm  in size were found in the sigmiod colon.  A polypectomy was performed with cold forceps for the smallest polyp an with a cold snare for the 2 larger polyps.  The resection was complete, the polyp tissue was completely retrieved and sent to histology.   Melanosis coli was found at the hepatic flexure, in the transverse colon, at the cecum, and in the ascending colon.   The examination was otherwise normal.  Retroflexed views revealed internal Grade I hemorrhoids. The time to cecum=2 minutes 13 seconds.  Withdrawal time=13 minutes 21 seconds.  The scope was withdrawn and the  procedure completed.  COMPLICATIONS: There were no immediate complications.  ENDOSCOPIC IMPRESSION: 1.   Three sessile polyps in the sigmoid colon; polypectomy performed with cold forceps and with a cold snare 2.   Melanosis coli at the hepatic flexure, in the transverse colon, at the cecum, and in the ascending colon 3.   Grade I internal hemorrhoids  RECOMMENDATIONS: 1.  Await pathology results 2.  Repeat colonoscopy in 5 years if polyp(s) adenomatous; otherwise 10 years  eSigned:  Ladene Artist, MD, [facility] 03/04/2014 10:06 AM

## 2014-03-04 NOTE — Progress Notes (Signed)
Called to room to assist during endoscopic procedure.  Patient ID and intended procedure confirmed with present staff. Received instructions for my participation in the procedure from the performing physician.  

## 2014-03-04 NOTE — Patient Instructions (Signed)
Discharge instructions given with verbal understanding. Handouts on polyps and hemorrhoids. Resume previous medications. YOU HAD AN ENDOSCOPIC PROCEDURE TODAY AT [facility]: Refer to the procedure report that was given to you for any specific questions about what was found during the examination.  If the procedure report does not answer your questions, please call your gastroenterologist to clarify.  If you requested that your care partner not be given the details of your procedure findings, then the procedure report has been included in a sealed envelope for you to review at your convenience later.  YOU SHOULD EXPECT: Some feelings of bloating in the abdomen. Passage of more gas than usual.  Walking can help get rid of the air that was put into your GI tract during the procedure and reduce the bloating. If you had a lower endoscopy (such as a colonoscopy or flexible sigmoidoscopy) you may notice spotting of blood in your stool or on the toilet paper. If you underwent a bowel prep for your procedure, then you may not have a normal bowel movement for a few days.  DIET: Your first meal following the procedure should be a light meal and then it is ok to progress to your normal diet.  A half-sandwich or bowl of soup is an example of a good first meal.  Heavy or fried foods are harder to digest and may make you feel nauseous or bloated.  Likewise meals heavy in dairy and vegetables can cause extra gas to form and this can also increase the bloating.  Drink plenty of fluids but you should avoid alcoholic beverages for 24 hours.  ACTIVITY: Your care partner should take you home directly after the procedure.  You should plan to take it easy, moving slowly for the rest of the day.  You can resume normal activity the day after the procedure however you should NOT DRIVE or use heavy machinery for 24 hours (because of the sedation medicines used during the test).    SYMPTOMS TO REPORT  IMMEDIATELY: A gastroenterologist can be reached at any hour.  During normal business hours, 8:30 AM to 5:00 PM Monday through Friday, call [phone].  After hours and on weekends, please call the GI answering service at [phone] who will take a message and have the physician on call contact you.   Following lower endoscopy (colonoscopy or flexible sigmoidoscopy):  Excessive amounts of blood in the stool  Significant tenderness or worsening of abdominal pains  Swelling of the abdomen that is new, acute  Fever of 100F or higher  FOLLOW UP: If any biopsies were taken you will be contacted by phone or by letter within the next 1-3 weeks.  Call your gastroenterologist if you have not heard about the biopsies in 3 weeks.  Our staff will call the home number listed on your records the next business day following your procedure to check on you and address any questions or concerns that you may have at that time regarding the information given to you following your procedure. This is a courtesy call and so if there is no answer at the home number and we have not heard from you through the emergency physician on call, we will assume that you have returned to your regular daily activities without incident.  SIGNATURES/CONFIDENTIALITY: You and/or your care partner have signed paperwork which will be entered into your electronic medical record.  These signatures attest to the fact that that the information above on your After Visit Summary  has been reviewed and is understood.  Full responsibility of the confidentiality of this discharge information lies with you and/or your care-partner. 

## 2014-03-04 NOTE — Progress Notes (Signed)
Report to PACU, RN, vss, BBS= Clear.  

## 2014-03-05 ENCOUNTER — Telehealth: Payer: Self-pay

## 2014-03-05 NOTE — Telephone Encounter (Signed)
No answer, voice mail not setup to leave message.

## 2014-03-11 ENCOUNTER — Encounter: Payer: Self-pay | Admitting: Gastroenterology

## 2014-03-29 ENCOUNTER — Telehealth: Payer: Self-pay | Admitting: Internal Medicine

## 2014-03-29 NOTE — Telephone Encounter (Signed)
Patient is requesting shingles vac.  Is this ok?

## 2014-07-19 ENCOUNTER — Other Ambulatory Visit: Payer: Self-pay | Admitting: Physician Assistant

## 2014-08-11 ENCOUNTER — Telehealth: Payer: Self-pay | Admitting: Internal Medicine

## 2014-08-11 MED ORDER — RANITIDINE HCL 150 MG PO TABS
150.0000 mg | ORAL_TABLET | Freq: Every day | ORAL | Status: DC
Start: 2014-08-11 — End: 2015-01-16

## 2014-08-11 MED ORDER — ATORVASTATIN CALCIUM 20 MG PO TABS: 20.0000 mg | ORAL_TABLET | Freq: Every day | ORAL | Status: DC

## 2014-08-11 NOTE — Telephone Encounter (Signed)
Notified pt refills sent to [facility]...Johny [facility]ss

## 2014-08-11 NOTE — Telephone Encounter (Signed)
Pt request refill for acid reflux and cholesterol to be send to [facility] mail order.

## 2015-01-16 ENCOUNTER — Other Ambulatory Visit: Payer: Self-pay | Admitting: Internal Medicine

## 2015-09-14 ENCOUNTER — Other Ambulatory Visit: Payer: Self-pay | Admitting: Internal Medicine

## 2015-11-07 ENCOUNTER — Other Ambulatory Visit (INDEPENDENT_AMBULATORY_CARE_PROVIDER_SITE_OTHER): Payer: Commercial Managed Care - HMO

## 2015-11-07 ENCOUNTER — Encounter: Payer: Self-pay | Admitting: Internal Medicine

## 2015-11-07 ENCOUNTER — Ambulatory Visit (INDEPENDENT_AMBULATORY_CARE_PROVIDER_SITE_OTHER): Payer: Commercial Managed Care - HMO | Admitting: Internal Medicine

## 2015-11-07 VITALS — BP 138/74 | HR 79 | Temp 98.7°F | Resp 18 | Ht 63.0 in | Wt 135.0 lb

## 2015-11-07 DIAGNOSIS — Z299 Encounter for prophylactic measures, unspecified: Secondary | ICD-10-CM

## 2015-11-07 DIAGNOSIS — Z0000 Encounter for general adult medical examination without abnormal findings: Secondary | ICD-10-CM

## 2015-11-07 DIAGNOSIS — E785 Hyperlipidemia, unspecified: Secondary | ICD-10-CM | POA: Diagnosis not present

## 2015-11-07 DIAGNOSIS — Z23 Encounter for immunization: Secondary | ICD-10-CM | POA: Diagnosis not present

## 2015-11-07 DIAGNOSIS — Z1159 Encounter for screening for other viral diseases: Secondary | ICD-10-CM | POA: Diagnosis not present

## 2015-11-07 DIAGNOSIS — E782 Mixed hyperlipidemia: Secondary | ICD-10-CM

## 2015-11-07 LAB — LIPID PANEL
CHOLESTEROL: 209 mg/dL — AB (ref 0–200)
HDL: 53.9 mg/dL (ref >39.00)
NonHDL: 154.61
TRIGLYCERIDES: 311 mg/dL — AB (ref 0.0–149.0)
Total CHOL/HDL Ratio: 4
VLDL: 62.2 mg/dL — ABNORMAL HIGH (ref 0.0–40.0)

## 2015-11-07 LAB — CBC
HCT: 40.5 % (ref 36.0–46.0)
Hemoglobin: 13.4 g/dL (ref 12.0–15.0)
MCHC: 33 g/dL (ref 30.0–36.0)
MCV: 91.5 fl (ref 78.0–100.0)
Platelets: 262 10*3/uL (ref 150.0–400.0)
RBC: 4.42 Mil/uL (ref 3.87–5.11)
RDW: 13.9 % (ref 11.5–15.5)
WBC: 6.2 10*3/uL (ref 4.0–10.5)

## 2015-11-07 LAB — COMPREHENSIVE METABOLIC PANEL
ALK PHOS: 51 U/L (ref 39–117)
ALT: 14 U/L (ref 0–35)
AST: 19 U/L (ref 0–37)
Albumin: 3.9 g/dL (ref 3.5–5.2)
BILIRUBIN TOTAL: 0.4 mg/dL (ref 0.2–1.2)
BUN: 7 mg/dL (ref 6–23)
CO2: 29 meq/L (ref 19–32)
CREATININE: 0.72 mg/dL (ref 0.40–1.20)
Calcium: 9.4 mg/dL (ref 8.4–10.5)
Chloride: 105 mEq/L (ref 96–112)
GFR: 85.71 mL/min (ref >60.00)
GLUCOSE: 89 mg/dL (ref 70–99)
Potassium: 4.1 mEq/L (ref 3.5–5.1)
Sodium: 139 mEq/L (ref 135–145)
TOTAL PROTEIN: 6.6 g/dL (ref 6.0–8.3)

## 2015-11-07 LAB — LDL CHOLESTEROL, DIRECT: Direct LDL: 108 mg/dL

## 2015-11-07 LAB — HEPATITIS C ANTIBODY: HCV AB: NEGATIVE

## 2015-11-07 NOTE — Assessment & Plan Note (Signed)
Taking lipitor every other day right now due to some muscle aches. Checking lipid panel and adjust as needed.

## 2015-11-07 NOTE — Progress Notes (Signed)
   Subjective:    Patient ID: Gina King, female    DOB: [birth year], 70 y.o.   MRN: [id number]  HPI Here for medicare wellness, no new complaints. Please see A/P for status and treatment of chronic medical problems.   Diet: heart healthy Physical activity: sedentary Depression/mood screen: negative Hearing: intact to whispered voice Visual acuity: grossly normal, performs annual eye exam  ADLs: capable Fall risk: none Home safety: good Cognitive evaluation: intact to orientation, naming, recall and repetition EOL planning: adv directives discussed  I have personally reviewed and have noted 1. The patient's medical and social history - reviewed today no changes 2. Their use of alcohol, tobacco or illicit drugs 3. Their current medications and supplements 4. The patient's functional ability including ADL's, fall risks, home safety risks and hearing or visual impairment. 5. Diet and physical activities 6. Evidence for depression or mood disorders 7. Care team reviewed and updated (available in snapshot)  Review of Systems  Constitutional: Negative for activity change, appetite change and fatigue.  HENT: Negative.   Eyes: Negative.   Respiratory: Negative for cough, chest tightness, shortness of breath and wheezing.   Cardiovascular: Negative for chest pain, palpitations and leg swelling.  Gastrointestinal: Negative for abdominal pain, diarrhea, constipation, blood in stool and abdominal distention.  Endocrine: Negative.   Musculoskeletal: Negative for myalgias, arthralgias and gait problem.  Skin: Negative.   Neurological: Negative.   Psychiatric/Behavioral: Negative.       Objective:   Physical Exam  Constitutional: She is oriented to person, place, and time. She appears well-developed and well-nourished. No distress.  HENT:  Head: Normocephalic and atraumatic.  Eyes: EOM are normal.  Neck: Normal range of motion. No JVD present.  Cardiovascular: Normal rate and  regular rhythm.   Pulmonary/Chest: Effort normal and breath sounds normal. No respiratory distress. She has no wheezes. She has no rales.  Abdominal: Soft. Bowel sounds are normal. She exhibits no distension. There is no tenderness. There is no rebound.  Musculoskeletal: She exhibits no edema.  Neurological: She is alert and oriented to person, place, and time. Coordination normal.  Skin: Skin is warm and dry.  Psychiatric: She has a normal mood and affect.  Vitals reviewed.  Filed Vitals:   11/07/15 1310  BP: 138/74  Pulse: 79  Temp: 98.7 F (37.1 C)  TempSrc: Oral  Resp: 18  Height: 5\' 3"  (1.6 m)  Weight: 135 lb (61.236 kg)  SpO2: 97%      Assessment & Plan:  Prevnar 13 and shingles given at visit.

## 2015-11-07 NOTE — Progress Notes (Signed)
Pre visit review using our clinic review tool, if applicable. No additional management support is needed unless otherwise documented below in the visit note. 

## 2015-11-07 NOTE — Assessment & Plan Note (Signed)
Checking labs including hep c screening. Needs mammogram and reminded. Colonoscopy up to date and not due for 8 years. Has had bone density in the past. Counseled about sun safety and mole surveillance. Given 10 year screening recommendations.

## 2015-11-07 NOTE — Patient Instructions (Signed)
We have given you the shingles shot and the second pneumonia shot.   We are checking the labs today and will send the results on mychart.

## 2016-01-30 ENCOUNTER — Telehealth: Payer: Self-pay | Admitting: Internal Medicine

## 2016-01-30 DIAGNOSIS — E2839 Other primary ovarian failure: Secondary | ICD-10-CM

## 2016-01-30 NOTE — Telephone Encounter (Signed)
Patient sent mychart message requesting a mammogram and dexa referral.

## 2016-01-30 NOTE — Telephone Encounter (Signed)
Placed for dexa here and mammogram with breast center.

## 2016-02-07 ENCOUNTER — Other Ambulatory Visit: Payer: Commercial Managed Care - HMO

## 2016-02-13 ENCOUNTER — Ambulatory Visit (INDEPENDENT_AMBULATORY_CARE_PROVIDER_SITE_OTHER)
Admission: RE | Admit: 2016-02-13 | Discharge: 2016-02-13 | Disposition: A | Discharge status: Home / Self Care | Payer: Commercial Managed Care - HMO | Source: Ambulatory Visit | Attending: Internal Medicine | Admitting: Internal Medicine

## 2016-02-13 ENCOUNTER — Ambulatory Visit (INDEPENDENT_AMBULATORY_CARE_PROVIDER_SITE_OTHER): Payer: Commercial Managed Care - HMO

## 2016-02-13 DIAGNOSIS — Z23 Encounter for immunization: Secondary | ICD-10-CM | POA: Diagnosis not present

## 2016-02-13 DIAGNOSIS — E2839 Other primary ovarian failure: Secondary | ICD-10-CM

## 2016-02-21 ENCOUNTER — Ambulatory Visit: Payer: Commercial Managed Care - HMO

## 2016-03-01 ENCOUNTER — Other Ambulatory Visit: Payer: Self-pay | Admitting: Internal Medicine

## 2016-03-01 DIAGNOSIS — Z1239 Encounter for other screening for malignant neoplasm of breast: Secondary | ICD-10-CM

## 2016-03-26 ENCOUNTER — Ambulatory Visit
Admission: RE | Admit: 2016-03-26 | Discharge: 2016-03-26 | Disposition: A | Discharge status: Home / Self Care | Payer: Commercial Managed Care - HMO | Source: Ambulatory Visit | Attending: Internal Medicine | Admitting: Internal Medicine

## 2016-03-26 DIAGNOSIS — Z1239 Encounter for other screening for malignant neoplasm of breast: Secondary | ICD-10-CM

## 2016-03-26 DIAGNOSIS — Z1231 Encounter for screening mammogram for malignant neoplasm of breast: Secondary | ICD-10-CM | POA: Diagnosis not present

## 2016-04-01 ENCOUNTER — Other Ambulatory Visit: Payer: Self-pay | Admitting: Internal Medicine

## 2016-04-16 ENCOUNTER — Ambulatory Visit (INDEPENDENT_AMBULATORY_CARE_PROVIDER_SITE_OTHER): Payer: Commercial Managed Care - HMO | Admitting: Internal Medicine

## 2016-04-16 ENCOUNTER — Encounter: Payer: Self-pay | Admitting: Internal Medicine

## 2016-04-16 ENCOUNTER — Other Ambulatory Visit (INDEPENDENT_AMBULATORY_CARE_PROVIDER_SITE_OTHER): Payer: Commercial Managed Care - HMO

## 2016-04-16 VITALS — BP 148/88 | HR 78 | Temp 98.3°F | Resp 16 | Ht 63.0 in | Wt 132.0 lb

## 2016-04-16 DIAGNOSIS — E785 Hyperlipidemia, unspecified: Secondary | ICD-10-CM

## 2016-04-16 DIAGNOSIS — E782 Mixed hyperlipidemia: Secondary | ICD-10-CM

## 2016-04-16 LAB — COMPREHENSIVE METABOLIC PANEL
ALBUMIN: 4 g/dL (ref 3.5–5.2)
ALT: 13 U/L (ref 0–35)
AST: 16 U/L (ref 0–37)
Alkaline Phosphatase: 42 U/L (ref 39–117)
BILIRUBIN TOTAL: 0.4 mg/dL (ref 0.2–1.2)
BUN: 12 mg/dL (ref 6–23)
CALCIUM: 9.2 mg/dL (ref 8.4–10.5)
CHLORIDE: 106 meq/L (ref 96–112)
CO2: 33 mEq/L — ABNORMAL HIGH (ref 19–32)
CREATININE: 0.85 mg/dL (ref 0.40–1.20)
GFR: 70.68 mL/min (ref >60.00)
Glucose, Bld: 111 mg/dL — ABNORMAL HIGH (ref 70–99)
Potassium: 3.7 mEq/L (ref 3.5–5.1)
Sodium: 143 mEq/L (ref 135–145)
Total Protein: 6.7 g/dL (ref 6.0–8.3)

## 2016-04-16 LAB — CK: Total CK: 85 U/L (ref 7–177)

## 2016-04-16 NOTE — Patient Instructions (Signed)
We will have you stop the lipitor for about 2 weeks then call and let us know if you are feeling better.   If so we will change the cholesterol medicine to one that does not bother the muscles and joints.   We are checking the labs today to make sure there isn't anything else causing the cramps.

## 2016-04-16 NOTE — Assessment & Plan Note (Addendum)
She is taking lipitor and now having problems with muscle cramps and some shoulder pain. Will check CK and CMP today to rule out other causes. Stop lipitor for 2 weeks and if symptoms resolve switch with pravastatin. If we make a switch needs lipid panel in 3-6 months.

## 2016-04-16 NOTE — Progress Notes (Signed)
   Subjective:    Patient ID: Gina King, female    DOB: 09-02-1945, 70 y.o.   MRN: [id number]  HPI The patient is a 70 YO female coming in for muscle cramps. She has had them in the past but worse in the last several weeks. She had a very severe one in her right leg and thigh which is still aching 2-3 days ago. Lasted about 10 minutes or so. She is taking lipitor for her cholesterol and she has heard it can cause muscle problems. No fevers or chills. No weight change. No diet change and drinking plenty of water.   Review of Systems  Constitutional: Negative.   Respiratory: Negative.   Cardiovascular: Negative.   Gastrointestinal: Negative.   Musculoskeletal: Positive for arthralgias and myalgias. Negative for back pain, gait problem, joint swelling, neck pain and neck stiffness.  Skin: Negative.   Neurological: Negative.       Objective:   Physical Exam  Constitutional: She is oriented to person, place, and time. She appears well-developed and well-nourished.  HENT:  Head: Normocephalic and atraumatic.  Eyes: EOM are normal.  Neck: Normal range of motion.  Cardiovascular: Normal rate and regular rhythm.   Pulmonary/Chest: Effort normal and breath sounds normal. No respiratory distress. She has no wheezes. She has no rales.  Abdominal: Soft. She exhibits no distension. There is no tenderness. There is no rebound.  Musculoskeletal: She exhibits tenderness.  Mild soreness lateral right thigh  Neurological: She is alert and oriented to person, place, and time.  Skin: Skin is warm and dry.   Vitals:   04/16/16 1059  BP: (!) 150/82  Pulse: 78  Resp: 16  Temp: 98.3 F (36.8 C)  TempSrc: Oral  SpO2: 98%  Weight: 132 lb (59.9 kg)  Height: 5\' 3"  (1.6 m)      Assessment & Plan:

## 2016-04-16 NOTE — Progress Notes (Signed)
Pre visit review using our clinic review tool, if applicable. No additional management support is needed unless otherwise documented below in the visit note. 

## 2016-09-19 ENCOUNTER — Other Ambulatory Visit: Payer: Self-pay | Admitting: Internal Medicine

## 2016-11-06 ENCOUNTER — Ambulatory Visit (INDEPENDENT_AMBULATORY_CARE_PROVIDER_SITE_OTHER)
Admission: RE | Admit: 2016-11-06 | Discharge: 2016-11-06 | Disposition: A | Discharge status: Home / Self Care | Payer: Medicare HMO | Source: Ambulatory Visit | Attending: Internal Medicine | Admitting: Internal Medicine

## 2016-11-06 ENCOUNTER — Encounter: Payer: Self-pay | Admitting: Internal Medicine

## 2016-11-06 ENCOUNTER — Ambulatory Visit (INDEPENDENT_AMBULATORY_CARE_PROVIDER_SITE_OTHER): Payer: Medicare HMO | Admitting: Internal Medicine

## 2016-11-06 ENCOUNTER — Other Ambulatory Visit: Payer: Medicare HMO

## 2016-11-06 VITALS — BP 142/76 | HR 91 | Temp 98.6°F | Resp 12 | Ht 63.0 in | Wt 122.0 lb

## 2016-11-06 DIAGNOSIS — Z0000 Encounter for general adult medical examination without abnormal findings: Secondary | ICD-10-CM | POA: Diagnosis not present

## 2016-11-06 DIAGNOSIS — G8929 Other chronic pain: Secondary | ICD-10-CM

## 2016-11-06 DIAGNOSIS — M25511 Pain in right shoulder: Secondary | ICD-10-CM | POA: Diagnosis not present

## 2016-11-06 MED ORDER — MELOXICAM 15 MG PO TABS: 15.0000 mg | ORAL_TABLET | Freq: Every day | ORAL | 0 refills | Status: DC

## 2016-11-06 NOTE — Patient Instructions (Signed)
We will take an x-ray of the shoulder today and call you back with the results.   We have sent in meloxicam to take daily for the next 1-2 weeks to help with the pain.  We are checking the labs to see if there is anything wrong there.

## 2016-11-07 ENCOUNTER — Other Ambulatory Visit (INDEPENDENT_AMBULATORY_CARE_PROVIDER_SITE_OTHER): Payer: Medicare HMO

## 2016-11-07 DIAGNOSIS — G8929 Other chronic pain: Secondary | ICD-10-CM | POA: Insufficient documentation

## 2016-11-07 DIAGNOSIS — Z0000 Encounter for general adult medical examination without abnormal findings: Secondary | ICD-10-CM

## 2016-11-07 DIAGNOSIS — M25511 Pain in right shoulder: Secondary | ICD-10-CM

## 2016-11-07 LAB — TSH: TSH: 0.87 u[IU]/mL (ref 0.35–4.50)

## 2016-11-07 LAB — COMPREHENSIVE METABOLIC PANEL
ALT: 12 U/L (ref 0–35)
AST: 19 U/L (ref 0–37)
Albumin: 3.9 g/dL (ref 3.5–5.2)
Alkaline Phosphatase: 37 U/L — ABNORMAL LOW (ref 39–117)
BILIRUBIN TOTAL: 0.5 mg/dL (ref 0.2–1.2)
BUN: 12 mg/dL (ref 6–23)
CALCIUM: 9.9 mg/dL (ref 8.4–10.5)
CHLORIDE: 102 meq/L (ref 96–112)
CO2: 29 meq/L (ref 19–32)
CREATININE: 0.89 mg/dL (ref 0.40–1.20)
GFR: 66.91 mL/min (ref >60.00)
GLUCOSE: 93 mg/dL (ref 70–99)
Potassium: 3.8 mEq/L (ref 3.5–5.1)
SODIUM: 139 meq/L (ref 135–145)
Total Protein: 6.8 g/dL (ref 6.0–8.3)

## 2016-11-07 LAB — CBC
HEMATOCRIT: 42.4 % (ref 36.0–46.0)
Hemoglobin: 14.3 g/dL (ref 12.0–15.0)
MCHC: 33.7 g/dL (ref 30.0–36.0)
MCV: 92.3 fl (ref 78.0–100.0)
PLATELETS: 249 10*3/uL (ref 150.0–400.0)
RBC: 4.59 Mil/uL (ref 3.87–5.11)
RDW: 13.8 % (ref 11.5–15.5)
WBC: 4.2 10*3/uL (ref 4.0–10.5)

## 2016-11-07 LAB — LIPID PANEL
CHOL/HDL RATIO: 4
Cholesterol: 266 mg/dL — ABNORMAL HIGH (ref 0–200)
HDL: 62.8 mg/dL (ref >39.00)
LDL Cholesterol: 179 mg/dL — ABNORMAL HIGH (ref 0–99)
NONHDL: 203.14
Triglycerides: 119 mg/dL (ref 0.0–149.0)
VLDL: 23.8 mg/dL (ref 0.0–40.0)

## 2016-11-07 LAB — T4, FREE: Free T4: 0.87 ng/dL (ref 0.60–1.60)

## 2016-11-07 LAB — VITAMIN D 25 HYDROXY (VIT D DEFICIENCY, FRACTURES): VITD: 35.8 ng/mL (ref 30.00–100.00)

## 2016-11-07 NOTE — Assessment & Plan Note (Signed)
Checking x-ray right shoulder. Suspect some arthritis or bursitis in the shoulder. If no arthritis will refer to sports medicine for evaluation and treatment. Talked to her about stretching exercises.

## 2016-11-07 NOTE — Progress Notes (Signed)
   Subjective:    Patient ID: BRONWYN BELASCO, female    DOB: 1946/01/17, 71 y.o.   MRN: [id number]  HPI The patient is a 71 YO female coming in for right shoulder pain for about 3 weeks or so. She has had this off and on over time. She denies injury or overuse prior to onset. More sore when she lies on that side. She denies numbness or weakness down in the arm. No soreness in the neck or back. No decrease in ROM. Some old injury there. No prior surgery to the area. Has taken tylenol which is helping some.   Review of Systems  Constitutional: Negative.   Respiratory: Negative.   Cardiovascular: Negative.   Gastrointestinal: Negative.   Musculoskeletal: Positive for arthralgias and myalgias. Negative for back pain, gait problem, joint swelling, neck pain and neck stiffness.  Skin: Negative.   Neurological: Negative.       Objective:   Physical Exam  Constitutional: She is oriented to person, place, and time. She appears well-developed and well-nourished.  HENT:  Head: Normocephalic and atraumatic.  Eyes: EOM are normal.  Neck: Normal range of motion.  Cardiovascular: Normal rate and regular rhythm.   Pulmonary/Chest: Effort normal.  Abdominal: Soft.  Musculoskeletal: She exhibits tenderness.  Pain in the right shoulder at the [facility] joint, no pain in the neck or upper back  Neurological: She is alert and oriented to person, place, and time.  Skin: Skin is warm and dry.   Vitals:   11/06/16 1514  BP: (!) 142/76  Pulse: 91  Resp: 12  Temp: 98.6 F (37 C)  TempSrc: Oral  SpO2: 98%  Weight: 122 lb (55.3 kg)  Height: 5\' 3"  (1.6 m)      Assessment & Plan:

## 2016-11-28 ENCOUNTER — Other Ambulatory Visit: Payer: Self-pay | Admitting: Internal Medicine

## 2016-12-21 ENCOUNTER — Ambulatory Visit (INDEPENDENT_AMBULATORY_CARE_PROVIDER_SITE_OTHER): Payer: Medicare HMO | Admitting: Family

## 2016-12-21 ENCOUNTER — Encounter: Payer: Self-pay | Admitting: Family

## 2016-12-21 DIAGNOSIS — G8929 Other chronic pain: Secondary | ICD-10-CM

## 2016-12-21 DIAGNOSIS — M25511 Pain in right shoulder: Secondary | ICD-10-CM | POA: Diagnosis not present

## 2016-12-21 MED ORDER — PREDNISONE 20 MG PO TABS: ORAL_TABLET | ORAL | 0 refills | Status: DC

## 2016-12-21 NOTE — Assessment & Plan Note (Signed)
Chronic right shoulder pain refractory to meloxicam and conservative treatment consistent with rotator cuff tendinitis. Strength remains good unlikely rotator cuff tear. Patient declines cortisone injection today. Start oral prednisone. Continue with conservative treatment including ice and home exercise therapy. Follow-up in 3 weeks or sooner if needed. Consider physical therapy referral or MRI if symptoms worsen or do not improve.

## 2016-12-21 NOTE — Progress Notes (Signed)
Subjective:    Patient ID: Gina King, female    DOB: 03-24-1946, 71 y.o.   MRN: [id number]  Chief Complaint  Patient presents with  . Shoulder Pain    right shoulder pain, x2 months     HPI:  Gina King is a 71 y.o. female who  has a past medical history of GERD (gastroesophageal reflux disease). and presents today for an office visit.   Continues to experience the associated symptom of pain located in her right shoulder that has been going on for about 3 months. Previously evaluated in the office with concern for bursitis or osteoarthritis. X-rays were normal. Prescribed meloxicam which she reports taking as prescribed and denies adverse side effects with no significant improvement. Course of the symptoms have been worsening since initial office visit. 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 No neck pain, or numbness/tingling. No restricted range of motion or new trauma/injury. Unable to sleep on it secondary to pain.    Allergies  Allergen Reactions  . Gabapentin     REACTION: withdrawal symtoms when didnt wean      Outpatient Medications Prior to Visit  Medication Sig Dispense Refill  . atorvastatin (LIPITOR) 20 MG tablet TAKE 1 TABLET EVERY DAY 90 tablet 1  . cholecalciferol (VITAMIN D) 1000 UNITS tablet Take 1,000 Units by mouth daily.    . diphenhydrAMINE (SOMINEX) 25 MG tablet Take 25 mg by mouth at bedtime as needed for sleep.    . meloxicam (MOBIC) 15 MG tablet Take 1 tablet (15 mg total) by mouth daily. 30 tablet 0  . Multiple Vitamin (MULTIVITAMIN) tablet Take 1 tablet by mouth daily.    . ranitidine (ZANTAC) 150 MG tablet TAKE 1 TABLET EVERY DAY 90 tablet 2  . vitamin B-12 (CYANOCOBALAMIN) 1000 MCG tablet Take 1,000 mcg by mouth daily. Reported on 11/07/2015    . vitamin C (ASCORBIC ACID) 500 MG tablet Take 500 mg by mouth 2 (two) times daily.     No facility-administered medications prior to visit.       Past Surgical History:  Procedure Laterality  Date  . ABDOMINAL HYSTERECTOMY  1984      Past Medical History:  Diagnosis Date  . GERD (gastroesophageal reflux disease)       Review of Systems  Constitutional: Negative for chills and fever.  Respiratory: Negative for chest tightness and shortness of breath.   Musculoskeletal: Negative for neck pain and neck stiffness.       Positive for right shoulder pain  Neurological: Negative for weakness and numbness.      Objective:    BP (!) 142/88 (BP Location: Left Arm, Patient Position: Sitting, Cuff Size: Normal)   Pulse 93   Temp 98.2 F (36.8 C) (Oral)   Resp 16   Ht 5\' 3"  (1.6 m)   Wt 125 lb (56.7 kg)   SpO2 98%   BMI 22.14 kg/m  Nursing note and vital signs reviewed.  Physical Exam  Constitutional: She is oriented to person, place, and time. She appears well-developed and well-nourished. No distress.  Cardiovascular: Normal rate, regular rhythm, normal heart sounds and intact distal pulses.   Pulmonary/Chest: Effort normal and breath sounds normal.  Musculoskeletal:  Right shoulder - no obvious deformity, discoloration, or edema. Palpable tenderness of subacromial space. Range of motion within normal limits with slight decrease in muscle strength in flexion and abduction. Distal pulses and sensation are intact and appropriate. Negative Michel Bickers; negative Neer's impingement; negative  empty can.  Neurological: She is alert and oriented to person, place, and time.  Skin: Skin is warm and dry.  Psychiatric: She has a normal mood and affect. Her behavior is normal. Judgment and thought content normal.       Assessment & Plan:   Problem List Items Addressed This Visit      Other   Chronic right shoulder pain    Chronic right shoulder pain refractory to meloxicam and conservative treatment consistent with rotator cuff tendinitis. Strength remains good unlikely rotator cuff tear. Patient declines cortisone injection today. Start oral prednisone. Continue with  conservative treatment including ice and home exercise therapy. Follow-up in 3 weeks or sooner if needed. Consider physical therapy referral or MRI if symptoms worsen or do not improve.      Relevant Medications   predniSONE (DELTASONE) 20 MG tablet       I am having [name] start on predniSONE. I am also having her maintain her vitamin C, cholecalciferol, multivitamin, diphenhydrAMINE, vitamin B-12, atorvastatin, meloxicam, and ranitidine.   Meds ordered this encounter  Medications  . predniSONE (DELTASONE) 20 MG tablet    Sig: Take 3 tablets by mouth daily for 3 days then 2 tablets by mouth daily for 3 days then 1 tablet by mouth daily for 3 days.    Dispense:  18 tablet    Refill:  0    Order Specific Question:   Supervising Provider    Answer:   Pricilla Holm A [[id number]]     Follow-up: Return in about 3 weeks (around 01/11/2017), or if symptoms worsen or fail to improve.  Mauricio Po, FNP

## 2016-12-21 NOTE — Patient Instructions (Signed)
Thank you for choosing [facility].  SUMMARY AND INSTRUCTIONS:  Ice x 20 minutes every 2 hours and as needed following activity.  Prednisone taper.   Stretches and exercise daily.  Recommend physical therapy if able.  Check on cortisone injection.  Medication:  Your prescription(s) have been submitted to your pharmacy or been printed and provided for you. Please take as directed and contact our office if you believe you are having problem(s) with the medication(s) or have any questions.   Follow up:  If your symptoms worsen or fail to improve, please contact our office for further instruction, or in case of emergency go directly to the emergency room at the closest medical facility.    Rotator Cuff Tear Rehab After Surgery Ask your health care provider which exercises are safe for you. Do exercises exactly as told by your health care provider and adjust them as directed. It is normal to feel mild stretching, pulling, tightness, or discomfort as you do these exercises, but you should stop right away if you feel sudden pain or your pain gets worse. Do not begin these exercises until told by your health care provider. Stretching and range of motion exercises These exercises warm up your muscles and joints and improve the movement and flexibility of your shoulder. These exercises also help to relieve pain, numbness, and tingling. Exercise A: Pendulum  1. Stand near a wall or a surface that you can hold onto for balance. 2. Bend at the waist and let your left / right arm hang straight down. Use your other arm to keep your balance. 3. Relax your arm and shoulder muscles, and move your hips and your trunk so your left / right arm swings freely. Your arm should swing because of the motion of your body, not because you are using your arm or shoulder muscles. 4. Keep moving so your arm swings in the following directions, as told by your health care provider: ? Side to side. ? Forward  and backward. ? In clockwise and counterclockwise circles. Repeat __________ times, or for __________ seconds per direction. Complete this exercise __________ times a day. Exercise B: Flexion, seated  1. Sit in a stable chair so your left / right forearm can rest on a flat surface. Your elbow should rest at a height that keeps your upper arm next to your body. 2. Keeping your shoulder relaxed, lean forward at the waist and let your hand slide forward. Stop when you feel a stretch in your shoulder, or when you reach the angle that is recommended by your health care provider. 3. Hold for __________ seconds. 4. Slowly return to the starting position. Repeat __________ times. Complete this exercise __________ times a day. Exercise C: Flexion, standing  1. Stand and hold a broomstick, a cane, or a similar object. Place your hands a little more than shoulder-width apart on the object. Your left / right hand should be palm-up, and your other hand should be palm-down. 2. Push the stick down with your healthy arm to raise your left / right arm in front of your body, and then over your head. Use your other hand to help move the stick. Stop when you feel a stretch in your shoulder, or when you reach the angle that is recommended by your health care provider. ? Avoid shrugging your shoulder while you raise your arm. Keep your shoulder blade tucked down toward your spine. ? Keep your left / right shoulder muscles relaxed. 3. Hold for __________ seconds.  4. Slowly return to the starting position. Repeat __________ times. Complete this exercise __________ times a day. Exercise D: Abduction, supine  1. Lie on your back and hold a broomstick, a cane, or a similar object. Place your hands a little more than shoulder-width apart on the object. Your left / right hand should be palm-up, and your other hand should be palm-down. 2. Push the stick to raise your left / right arm out to your side and then over your  head. Use your other hand to help move the stick. Stop when you feel a stretch in your shoulder, or when you reach the angle that is recommended by your health care provider. ? Avoid shrugging your shoulder while you raise your arm. Keep your shoulder blade tucked down toward your spine. 3. Hold for __________ seconds. 4. Slowly return to the starting position. Repeat __________ times. Complete this exercise __________ times a day. Exercise E: Shoulder flexion, active-assisted  1. Lie on your back. You may bend your knees for comfort. 2. Hold a broomstick, a cane, or a similar object so your hands are about shoulder-width apart. Your palms should face toward your feet. 3. Raise your left / right arm over your head and behind your head, toward the floor. Use your other hand to help you do this. Stop when you feel a gentle stretch in your shoulder, or when you reach the angle that is recommended by your health care provider. 4. Hold for __________ seconds. 5. Use the broomstick and your other arm to help you return your left / right arm to the starting position. Repeat __________ times. Complete this exercise __________ times a day. Exercise F: External rotation  1. Sit in a stable chair without armrests, or stand. 2. Tuck a soft object, such as a folded towel or a small ball, under your left / right upper arm. 3. Hold a broomstick, a cane, or a similar object so your palms face down, toward the floor. Bend your elbows to an "L" shape (90 degrees), and keep your hands about shoulder-width apart. 4. Straighten your healthy arm and push the broomstick across your body, toward your left / right side. Keep your left / right arm bent. This will rotate your left / right forearm away from your body. 5. Hold for __________ seconds. 6. Slowly return to the starting position. Repeat __________ times. Complete this exercise __________ times a day. Strengthening exercises These exercises build strength and  endurance in your shoulder. Endurance is the ability to use your muscles for a long time, even after they get tired. Exercise G: Shoulder flexion, isometric  1. Stand or sit about 4-6 inches (10-15 cm) away from a wall with your left / right side facing the wall. 2. Gently make a fist and place your left / right hand on the wall so the top of your fist touches the wall. 3. With your left / right elbow straight, gently press the top of your fist into the wall. Gradually increase the pressure until you are pressing as hard as you can without shrugging your shoulder. 4. Hold for __________ seconds. 5. Slowly release the tension and relax your muscles completely before you repeat the exercise. Repeat __________ times. Complete this exercise __________ times a day. Exercise H: Shoulder abduction, isometric  1. Stand or sit about 4-6 inches (10-15 cm) away from a wall with your right/left side facing the wall. 2. Bend your left / right elbow and gently press your elbow into  the wall as if you are trying to move your arm out to your side. Increase the pressure gradually until you are pressing as hard as you can without shrugging your shoulder. 3. Hold for __________ seconds. 4. Slowly release the tension and relax your muscles completely before repeating the exercise. Repeat __________ times. Complete this exercise __________ times a day. Exercise I: Internal rotation, isometric  1. Stand or sit in a doorway, facing the door frame. 2. Bend your left / right elbow and place the palm of your hand against the door frame. Only your palm should be touching the frame. Keep your upper arm at your side. 3. Gently press your hand into the door frame, as if you are trying to push your arm toward your abdomen. Do not let your wrist bend. ? Avoid shrugging your shoulder while you press your hand into the door frame. Keep your shoulder blade tucked down toward the middle of your back. 4. Hold for __________  seconds. 5. Slowly release the tension, and relax your muscles completely before you repeat the exercise. Repeat __________ times. Complete this exercise __________ times a day. Exercise J: External rotation, isometric  1. Stand or sit in a doorway, facing the door frame. 2. Bend your left / right elbow and place the back of your wrist against the door frame. Only the back of your wrist should be touching the frame. Keep your upper arm at your side. 3. Gently press your wrist against the door frame, as if you are trying to push your arm away from your abdomen. ? Avoid shrugging your shoulder while you press your wrist into the door frame. Keep your shoulder blade tucked down toward the middle of your back. 4. Hold for __________ seconds. 5. Slowly release the tension, and relax your muscles completely before you repeat the exercise. Repeat __________ times. Complete this exercise __________ times a day. This information is not intended to replace advice given to you by your health care provider. Make sure you discuss any questions you have with your health care provider. Document Released: 05/21/2005 Document Revised: 01/26/2016 Document Reviewed: 06/04/2015 Elsevier Interactive Patient Education  [employer].

## 2017-04-01 ENCOUNTER — Other Ambulatory Visit: Payer: Self-pay | Admitting: Internal Medicine

## 2017-04-01 DIAGNOSIS — Z1231 Encounter for screening mammogram for malignant neoplasm of breast: Secondary | ICD-10-CM

## 2017-04-08 ENCOUNTER — Other Ambulatory Visit: Payer: Self-pay | Admitting: Internal Medicine

## 2017-04-22 ENCOUNTER — Ambulatory Visit
Admission: RE | Admit: 2017-04-22 | Discharge: 2017-04-22 | Disposition: A | Discharge status: Home / Self Care | Payer: Medicare HMO | Source: Ambulatory Visit | Attending: Internal Medicine | Admitting: Internal Medicine

## 2017-04-22 DIAGNOSIS — Z1231 Encounter for screening mammogram for malignant neoplasm of breast: Secondary | ICD-10-CM

## 2017-09-25 ENCOUNTER — Other Ambulatory Visit: Payer: Self-pay | Admitting: Family

## 2017-09-25 ENCOUNTER — Other Ambulatory Visit: Payer: Self-pay | Admitting: Internal Medicine

## 2017-10-29 ENCOUNTER — Ambulatory Visit (INDEPENDENT_AMBULATORY_CARE_PROVIDER_SITE_OTHER): Payer: Medicare HMO | Admitting: *Deleted

## 2017-10-29 VITALS — BP 134/68 | HR 82 | Resp 18 | Ht 63.0 in | Wt 125.0 lb

## 2017-10-29 DIAGNOSIS — Z0000 Encounter for general adult medical examination without abnormal findings: Secondary | ICD-10-CM

## 2017-10-29 DIAGNOSIS — H2513 Age-related nuclear cataract, bilateral: Secondary | ICD-10-CM | POA: Diagnosis not present

## 2017-10-29 NOTE — Patient Instructions (Signed)
Continue doing brain stimulating activities (puzzles, reading, adult coloring books, staying active) to keep memory sharp.   Continue to eat heart healthy diet (full of fruits, vegetables, whole grains, lean protein, water--limit salt, fat, and sugar intake) and increase physical activity as tolerated.   Gina King , Thank you for taking time to come for your Medicare Wellness Visit. I appreciate your ongoing commitment to your health goals. Please review the following plan we discussed and let me know if I can assist you in the future.   These are the goals we discussed: Goals    . Patient Stated     Maintain current health status by walking daily, eating healthy and enjoying life.       This is a list of the screening recommended for you and due dates:  Health Maintenance  Topic Date Due  . Flu Shot  01/02/2018  . Tetanus Vaccine  12/21/2018  . Mammogram  04/23/2019  . Colon Cancer Screening  03/04/2024  . DEXA scan (bone density measurement)  Completed  .  Hepatitis C: One time screening is recommended by Center for Disease Control  (CDC) for  adults born from [birth year] through 1963.   Completed  . Pneumonia vaccines  Completed

## 2017-10-29 NOTE — Progress Notes (Signed)
Medical screening examination/treatment/procedure(s) were performed by non-physician practitioner and as supervising physician I was immediately available for consultation/collaboration. I agree with above. Matthewjames Petrasek A Delwyn Scoggin, MD 

## 2017-10-29 NOTE — Progress Notes (Signed)
Subjective:   Gina King is a 72 y.o. female who presents for Medicare Annual (Subsequent) preventive examination.  Review of Systems:  No ROS.  Medicare Wellness Visit. Additional risk factors are reflected in the social history.  Cardiac Risk Factors include: advanced age (>57men, >72 women);dyslipidemia;hypertension Sleep patterns: feels rested on waking, gets up 1 times nightly to void and sleeps 7-8 hours nightly.    Home Safety/Smoke Alarms: Feels safe in home. Smoke alarms in place.  Living environment; residence and Firearm Safety: apartment, no firearms.Lives with husband, no needs for DME, good support system Seat Belt Safety/Bike Helmet: Wears seat belt.      Objective:     Vitals: BP 134/68   Pulse 82   Resp 18   Ht 5\' 3"  (1.6 m)   Wt 125 lb (56.7 kg)   SpO2 98%   BMI 22.14 kg/m   Body mass index is 22.14 kg/m.  Advanced Directives 10/29/2017 03/04/2014 02/25/2014  Does Patient Have a Medical Advance Directive? No No No  Would patient like information on creating a medical advance directive? Yes (ED - Information included in AVS) No - patient declined information No - patient declined information    Tobacco Social History   Tobacco Use  Smoking Status Never Smoker  Smokeless Tobacco Never Used     Counseling given: Not Answered     Past Medical History:  Diagnosis Date  . GERD (gastroesophageal reflux disease)    Past Surgical History:  Procedure Laterality Date  . ABDOMINAL HYSTERECTOMY  1984   Family History  Problem Relation Age of Onset  . Kidney failure Brother   . Colon cancer Neg Hx    Social History   Socioeconomic History  . Marital status: Married    Spouse name: Not on file  . Number of children: 2  . Years of education: 10 1/2 yrs  . Highest education level: Not on file  Occupational History    Employer: [employer]  Social Needs  . Financial resource strain: Not hard at all  . Food insecurity:    Worry: Never true      Inability: Never true  . Transportation needs:    Medical: No    Non-medical: No  Tobacco Use  . Smoking status: Never Smoker  . Smokeless tobacco: Never Used  Substance and Sexual Activity  . Alcohol use: No  . Drug use: No  . Sexual activity: Not Currently  Lifestyle  . Physical activity:    Days per week: 4 days    Minutes per session: 50 min  . Stress: Not at all  Relationships  . Social connections:    Talks on phone: More than three times a week    Gets together: More than three times a week    Attends religious service: More than 4 times per year    Active member of club or organization: Not on file    Attends meetings of clubs or organizations: More than 4 times per year    Relationship status: Married  Other Topics Concern  . Not on file  Social History Narrative  . Not on file    Outpatient Encounter Medications as of 10/29/2017  Medication Sig  . atorvastatin (LIPITOR) 20 MG tablet Take 1 tablet (20 mg total) by mouth daily. -- Office visit needed for further refills  . cholecalciferol (VITAMIN D) 1000 UNITS tablet Take 1,000 Units by mouth daily.  . diphenhydrAMINE (SOMINEX) 25 MG tablet Take 25 mg by  mouth at bedtime as needed for sleep.  . Multiple Vitamin (MULTIVITAMIN) tablet Take 1 tablet by mouth daily.  . ranitidine (ZANTAC) 150 MG tablet TAKE 1 TABLET EVERY DAY  . vitamin B-12 (CYANOCOBALAMIN) 1000 MCG tablet Take 1,000 mcg by mouth daily. Reported on 11/07/2015  . vitamin C (ASCORBIC ACID) 500 MG tablet Take 500 mg by mouth 2 (two) times daily.  . [DISCONTINUED] meloxicam (MOBIC) 15 MG tablet Take 1 tablet (15 mg total) by mouth daily. (Patient not taking: Reported on 10/29/2017)  . [DISCONTINUED] predniSONE (DELTASONE) 20 MG tablet Take 3 tablets by mouth daily for 3 days then 2 tablets by mouth daily for 3 days then 1 tablet by mouth daily for 3 days. (Patient not taking: Reported on 10/29/2017)   No facility-administered encounter medications on file  as of 10/29/2017.     Activities of Daily Living In your present state of health, do you have any difficulty performing the following activities: 10/29/2017  Hearing? N  Vision? N  Difficulty concentrating or making decisions? N  Walking or climbing stairs? N  Dressing or bathing? N  Doing errands, shopping? N  Preparing Food and eating ? N  Using the Toilet? N  In the past six months, have you accidently leaked urine? N  Do you have problems with loss of bowel control? N  Managing your Medications? N  Managing your Finances? N  Housekeeping or managing your Housekeeping? N  Some recent data might be hidden    Patient Care Team: Hoyt Koch, MD as PCP - General (Internal Medicine)    Assessment:   This is a routine wellness examination for Gina King. Physical assessment deferred to PCP.   Exercise Activities and Dietary recommendations Current Exercise Habits: Home exercise routine, Type of exercise: walking, Time (Minutes): 40, Frequency (Times/Week): 6, Weekly Exercise (Minutes/Week): 240, Intensity: Mild, Exercise limited by: None identified  Diet (meal preparation, eat out, water intake, caffeinated beverages, dairy products, fruits and vegetables): in general, a "healthy" diet  , well balanced, eats a variety of fruits and vegetables daily, limits salt, fat/cholesterol, sugar,carbohydrates,caffeine, drinks 6-8 glasses of water daily.  Goals    . Patient Stated     Maintain current health status by walking daily, eating healthy and enjoying life.       Fall Risk Fall Risk  10/29/2017 11/07/2015  Falls in the past year? No No    Depression Screen PHQ 2/9 Scores 10/29/2017 11/07/2015  PHQ - 2 Score 1 0  PHQ- 9 Score 1 -     Cognitive Function       Ad8 score reviewed for issues:  Issues making decisions: no  Less interest in hobbies / activities: no  Repeats questions, stories (family complaining): no  Trouble using ordinary gadgets (microwave, computer,  phone):no  Forgets the month or year: no  Mismanaging finances: no  Remembering appts: no  Daily problems with thinking and/or memory: no Ad8 score is= 0    Immunization History  Administered Date(s) Administered  . Influenza Split 03/04/2013  . Influenza Whole 04/18/2010  . Influenza, High Dose Seasonal PF 02/13/2016  . Influenza, Seasonal, Injecte, Preservative Fre 03/04/2013  . Influenza,inj,Quad PF,6+ Mos 02/22/2014  . Pneumococcal Conjugate-13 11/07/2015  . Pneumococcal Polysaccharide-23 02/22/2014  . Td 06/04/1997, 12/20/2008  . Zoster 11/07/2015   Screening Tests Health Maintenance  Topic Date Due  . INFLUENZA VACCINE  01/02/2018  . TETANUS/TDAP  12/21/2018  . MAMMOGRAM  04/23/2019  . COLONOSCOPY  03/04/2024  . DEXA  SCAN  Completed  . Hepatitis C Screening  Completed  . PNA vac Low Risk Adult  Completed      Plan:   Continue doing brain stimulating activities (puzzles, reading, adult coloring books, staying active) to keep memory sharp.   Continue to eat heart healthy diet (full of fruits, vegetables, whole grains, lean protein, water--limit salt, fat, and sugar intake) and increase physical activity as tolerated.   I have personally reviewed and noted the following in the patient's chart:   . Medical and social history . Use of alcohol, tobacco or illicit drugs  . Current medications and supplements . Functional ability and status . Nutritional status . Physical activity . Advanced directives . List of other physicians . Hospitalizations, surgeries, and ER visits in previous 12 months . Vitals . Screenings to include cognitive, depression, and falls . Referrals and appointments  In addition, I have reviewed and discussed with patient certain preventive protocols, quality metrics, and best practice recommendations. A written personalized care plan for preventive services as well as general preventive health recommendations were provided to patient.       Michiel Cowboy, RN  10/29/2017

## 2017-10-30 ENCOUNTER — Telehealth: Payer: Self-pay | Admitting: Family

## 2017-10-30 ENCOUNTER — Other Ambulatory Visit: Payer: Self-pay | Admitting: Internal Medicine

## 2017-11-13 NOTE — Addendum Note (Signed)
Addended by: Earnstine Regal on: 11/13/2017 02:19 PM   Modules accepted: Orders

## 2017-11-13 NOTE — Telephone Encounter (Addendum)
Relation to pt:  [place], [place] [phone] (Phone) [phone] (Fax)   Reason for call:  Vander checking on the status of ranitidine (ZANTAC) 150 MG tablet refill, informed mail order please allow 72 hour turn around, please advise

## 2017-11-13 NOTE — Telephone Encounter (Signed)
Pt is overdue for annual appt will need OV for 90 day scripts to mail order pharmacy.Marland Kitchenlmb

## 2017-12-03 ENCOUNTER — Other Ambulatory Visit: Payer: Self-pay | Admitting: Family

## 2017-12-09 ENCOUNTER — Telehealth: Payer: Self-pay | Admitting: Internal Medicine

## 2017-12-09 NOTE — Telephone Encounter (Signed)
Copied from [place] [phone]. Topic: Quick Communication - See Telephone Encounter >> Dec 09, 2017  1:48 PM Conception Chancy, NT wrote: CRM for notification. See Telephone encounter for: 12/09/17.  Patient is requesting a refill on ranitidine (ZANTAC) 150 MG tablet. Please advise.   [place], [place] [address] Phone: [phone] Fax: [phone]

## 2017-12-10 NOTE — Telephone Encounter (Signed)
Pt states she recently purchased some of the medication over the counter and does have enough medication until CPE on 7/12 with Dr. Sharlet Salina.

## 2017-12-13 ENCOUNTER — Ambulatory Visit (INDEPENDENT_AMBULATORY_CARE_PROVIDER_SITE_OTHER): Payer: Medicare HMO | Admitting: Internal Medicine

## 2017-12-13 ENCOUNTER — Encounter: Payer: Self-pay | Admitting: Internal Medicine

## 2017-12-13 ENCOUNTER — Other Ambulatory Visit (INDEPENDENT_AMBULATORY_CARE_PROVIDER_SITE_OTHER): Payer: Medicare HMO

## 2017-12-13 VITALS — BP 138/80 | HR 84 | Temp 98.2°F | Ht 63.0 in | Wt 124.0 lb

## 2017-12-13 DIAGNOSIS — M25511 Pain in right shoulder: Secondary | ICD-10-CM

## 2017-12-13 DIAGNOSIS — G8929 Other chronic pain: Secondary | ICD-10-CM | POA: Diagnosis not present

## 2017-12-13 DIAGNOSIS — Z0000 Encounter for general adult medical examination without abnormal findings: Secondary | ICD-10-CM | POA: Diagnosis not present

## 2017-12-13 DIAGNOSIS — E782 Mixed hyperlipidemia: Secondary | ICD-10-CM | POA: Diagnosis not present

## 2017-12-13 DIAGNOSIS — K219 Gastro-esophageal reflux disease without esophagitis: Secondary | ICD-10-CM

## 2017-12-13 LAB — COMPREHENSIVE METABOLIC PANEL
ALBUMIN: 4.1 g/dL (ref 3.5–5.2)
ALK PHOS: 43 U/L (ref 39–117)
ALT: 14 U/L (ref 0–35)
AST: 19 U/L (ref 0–37)
BILIRUBIN TOTAL: 0.5 mg/dL (ref 0.2–1.2)
BUN: 11 mg/dL (ref 6–23)
CALCIUM: 9.4 mg/dL (ref 8.4–10.5)
CO2: 28 mEq/L (ref 19–32)
Chloride: 102 mEq/L (ref 96–112)
Creatinine, Ser: 0.83 mg/dL (ref 0.40–1.20)
GFR: 72.29 mL/min (ref >60.00)
GLUCOSE: 103 mg/dL — AB (ref 70–99)
Potassium: 3.9 mEq/L (ref 3.5–5.1)
Sodium: 138 mEq/L (ref 135–145)
TOTAL PROTEIN: 6.8 g/dL (ref 6.0–8.3)

## 2017-12-13 LAB — LIPID PANEL
CHOLESTEROL: 176 mg/dL (ref 0–200)
HDL: 62.7 mg/dL (ref >39.00)
LDL Cholesterol: 90 mg/dL (ref 0–99)
NonHDL: 112.98
Total CHOL/HDL Ratio: 3
Triglycerides: 113 mg/dL (ref 0.0–149.0)
VLDL: 22.6 mg/dL (ref 0.0–40.0)

## 2017-12-13 LAB — CBC
HCT: 41.7 % (ref 36.0–46.0)
HEMOGLOBIN: 14.1 g/dL (ref 12.0–15.0)
MCHC: 33.9 g/dL (ref 30.0–36.0)
MCV: 93.8 fl (ref 78.0–100.0)
PLATELETS: 213 10*3/uL (ref 150.0–400.0)
RBC: 4.45 Mil/uL (ref 3.87–5.11)
RDW: 13.5 % (ref 11.5–15.5)
WBC: 5.6 10*3/uL (ref 4.0–10.5)

## 2017-12-13 LAB — VITAMIN D 25 HYDROXY (VIT D DEFICIENCY, FRACTURES): VITD: 25.64 ng/mL — AB (ref 30.00–100.00)

## 2017-12-13 LAB — TSH: TSH: 1.02 u[IU]/mL (ref 0.35–4.50)

## 2017-12-13 MED ORDER — ZOSTER VAC RECOMB ADJUVANTED 50 MCG/0.5ML IM SUSR: 0.5000 mL | Freq: Once | INTRAMUSCULAR | 1 refills | Status: AC

## 2017-12-13 NOTE — Progress Notes (Signed)
   Subjective:    Patient ID: Gina King, female    DOB: 07-10-1945, 72 y.o.   MRN: [id number]  HPI The patient is a 72 YO female coming in for physical. No new concerns.   PMH, [facility], social history reviewed and updated.   Review of Systems  Constitutional: Negative.   HENT: Negative.   Eyes: Negative.   Respiratory: Negative for cough, chest tightness and shortness of breath.   Cardiovascular: Negative for chest pain, palpitations and leg swelling.  Gastrointestinal: Negative for abdominal distention, abdominal pain, constipation, diarrhea, nausea and vomiting.  Musculoskeletal: Negative.   Skin: Negative.   Neurological: Negative.   Psychiatric/Behavioral: Negative.       Objective:   Physical Exam  Constitutional: She is oriented to person, place, and time. She appears well-developed and well-nourished.  HENT:  Head: Normocephalic and atraumatic.  Eyes: EOM are normal.  Neck: Normal range of motion.  Cardiovascular: Normal rate and regular rhythm.  Pulmonary/Chest: Effort normal and breath sounds normal. No respiratory distress. She has no wheezes. She has no rales.  Abdominal: Soft. Bowel sounds are normal. She exhibits no distension. There is no tenderness. There is no rebound.  Musculoskeletal: She exhibits no edema.  Neurological: She is alert and oriented to person, place, and time. Coordination normal.  Skin: Skin is warm and dry.  Psychiatric: She has a normal mood and affect.   Vitals:   12/13/17 1037  BP: 138/80  Pulse: 84  Temp: 98.2 F (36.8 C)  TempSrc: Oral  SpO2: 96%  Weight: 124 lb (56.2 kg)  Height: 5\' 3"  (1.6 m)      Assessment & Plan:

## 2017-12-13 NOTE — Assessment & Plan Note (Signed)
Improved some.

## 2017-12-13 NOTE — Assessment & Plan Note (Signed)
Flu shot yearly. Pneumonia complete. Shingrix given rx. Tetanus up to date. Colonoscopy up to date. Mammogram up to date, pap smear not indicated and dexa due 2020. Counseled about sun safety and mole surveillance. Counseled about the dangers of distracted driving. Given 10 year screening recommendations.

## 2017-12-13 NOTE — Assessment & Plan Note (Signed)
Controlled on zantac.   

## 2017-12-13 NOTE — Patient Instructions (Signed)

## 2017-12-13 NOTE — Assessment & Plan Note (Signed)
Taking lipitor 20 mg daily. Checking lipid panel and adjust as needed.  

## 2017-12-17 ENCOUNTER — Other Ambulatory Visit: Payer: Self-pay

## 2017-12-17 MED ORDER — RANITIDINE HCL 150 MG PO TABS: 150.0000 mg | ORAL_TABLET | Freq: Every day | ORAL | 3 refills | Status: DC

## 2018-03-25 ENCOUNTER — Other Ambulatory Visit: Payer: Self-pay | Admitting: Internal Medicine

## 2018-03-25 DIAGNOSIS — Z1231 Encounter for screening mammogram for malignant neoplasm of breast: Secondary | ICD-10-CM

## 2018-04-28 ENCOUNTER — Other Ambulatory Visit: Payer: Self-pay | Admitting: Internal Medicine

## 2018-05-05 ENCOUNTER — Ambulatory Visit
Admission: RE | Admit: 2018-05-05 | Discharge: 2018-05-05 | Disposition: A | Discharge status: Home / Self Care | Payer: Medicare HMO | Source: Ambulatory Visit | Attending: Internal Medicine | Admitting: Internal Medicine

## 2018-05-05 DIAGNOSIS — Z1231 Encounter for screening mammogram for malignant neoplasm of breast: Secondary | ICD-10-CM

## 2018-07-08 ENCOUNTER — Ambulatory Visit (INDEPENDENT_AMBULATORY_CARE_PROVIDER_SITE_OTHER): Payer: Medicare HMO | Admitting: Internal Medicine

## 2018-07-08 ENCOUNTER — Encounter: Payer: Self-pay | Admitting: Internal Medicine

## 2018-07-08 DIAGNOSIS — M545 Low back pain, unspecified: Secondary | ICD-10-CM | POA: Insufficient documentation

## 2018-07-08 NOTE — Patient Instructions (Signed)
You can use heat for the pain or take aleve.    Back Exercises If you have pain in your back, do these exercises 2-3 times each day or as told by your doctor. When the pain goes away, do the exercises once each day, but repeat the steps more times for each exercise (do more repetitions). If you do not have pain in your back, do these exercises once each day or as told by your doctor. Exercises Single Knee to Chest Do these steps 3-5 times in a row for each leg: 1. Lie on your back on a firm bed or the floor with your legs stretched out. 2. Bring one knee to your chest. 3. Hold your knee to your chest by grabbing your knee or thigh. 4. Pull on your knee until you feel a gentle stretch in your lower back. 5. Keep doing the stretch for 10-30 seconds. 6. Slowly let go of your leg and straighten it. Pelvic Tilt Do these steps 5-10 times in a row: 1. Lie on your back on a firm bed or the floor with your legs stretched out. 2. Bend your knees so they point up to the ceiling. Your feet should be flat on the floor. 3. Tighten your lower belly (abdomen) muscles to press your lower back against the floor. This will make your tailbone point up to the ceiling instead of pointing down to your feet or the floor. 4. Stay in this position for 5-10 seconds while you gently tighten your muscles and breathe evenly. Cat-Cow Do these steps until your lower back bends more easily: 1. Get on your hands and knees on a firm surface. Keep your hands under your shoulders, and keep your knees under your hips. You may put padding under your knees. 2. Let your head hang down, and make your tailbone point down to the floor so your lower back is round like the back of a cat. 3. Stay in this position for 5 seconds. 4. Slowly lift your head and make your tailbone point up to the ceiling so your back hangs low (sags) like the back of a cow. 5. Stay in this position for 5 seconds.  Press-Ups Do these steps 5-10 times in a  row: 1. Lie on your belly (face-down) on the floor. 2. Place your hands near your head, about shoulder-width apart. 3. While you keep your back relaxed and keep your hips on the floor, slowly straighten your arms to raise the top half of your body and lift your shoulders. Do not use your back muscles. To make yourself more comfortable, you may change where you place your hands. 4. Stay in this position for 5 seconds. 5. Slowly return to lying flat on the floor.  Bridges Do these steps 10 times in a row: 1. Lie on your back on a firm surface. 2. Bend your knees so they point up to the ceiling. Your feet should be flat on the floor. 3. Tighten your butt muscles and lift your butt off of the floor until your waist is almost as high as your knees. If you do not feel the muscles working in your butt and the back of your thighs, slide your feet 1-2 inches farther away from your butt. 4. Stay in this position for 3-5 seconds. 5. Slowly lower your butt to the floor, and let your butt muscles relax. If this exercise is too easy, try doing it with your arms crossed over your chest. Belly Crunches Do these  steps 5-10 times in a row: 1. Lie on your back on a firm bed or the floor with your legs stretched out. 2. Bend your knees so they point up to the ceiling. Your feet should be flat on the floor. 3. Cross your arms over your chest. 4. Tip your chin a little bit toward your chest but do not bend your neck. 5. Tighten your belly muscles and slowly raise your chest just enough to lift your shoulder blades a tiny bit off of the floor. 6. Slowly lower your chest and your head to the floor. Back Lifts Do these steps 5-10 times in a row: 1. Lie on your belly (face-down) with your arms at your sides, and rest your forehead on the floor. 2. Tighten the muscles in your legs and your butt. 3. Slowly lift your chest off of the floor while you keep your hips on the floor. Keep the back of your head in line with  the curve in your back. Look at the floor while you do this. 4. Stay in this position for 3-5 seconds. 5. Slowly lower your chest and your face to the floor. Contact a doctor if:  Your back pain gets a lot worse when you do an exercise.  Your back pain does not lessen 2 hours after you exercise. If you have any of these problems, stop doing the exercises. Do not do them again unless your doctor says it is okay. Get help right away if:  You have sudden, very bad back pain. If this happens, stop doing the exercises. Do not do them again unless your doctor says it is okay. This information is not intended to replace advice given to you by your health care provider. Make sure you discuss any questions you have with your health care provider. Document Released: 06/23/2010 Document Revised: 02/12/2018 Document Reviewed: 07/15/2014 Elsevier Interactive Patient Education  [employer].

## 2018-07-08 NOTE — Progress Notes (Signed)
   Subjective:   Patient ID: Gina King, female    DOB: 08-Jun-1945, 73 y.o.   MRN: [id number]  HPI The patient is a 73 YO female coming in for low back pain. Started about 3 weeks ago. Denies injury or overuse. Does admit to doing some moving of furniture and remodeling recently where she was doing some lifting. The pain did start after that. Has tried aleve which helped some. Overall it is improving. Rated it 8/10 at worst. Has not taken anything in several days since that time. Denies radiation of the pain. Denies numbness, weakness in legs.    Review of Systems  Constitutional: Positive for activity change. Negative for appetite change, chills, fatigue, fever and unexpected weight change.  Respiratory: Negative.   Cardiovascular: Negative.   Gastrointestinal: Negative.   Musculoskeletal: Positive for arthralgias, back pain and myalgias. Negative for gait problem and joint swelling.  Skin: Negative.   Neurological: Negative.     Objective:  Physical Exam Constitutional:      Appearance: She is well-developed.  HENT:     Head: Normocephalic and atraumatic.  Neck:     Musculoskeletal: Normal range of motion.  Cardiovascular:     Rate and Rhythm: Normal rate and regular rhythm.  Pulmonary:     Effort: Pulmonary effort is normal. No respiratory distress.     Breath sounds: Normal breath sounds. No wheezing or rales.  Abdominal:     General: Bowel sounds are normal. There is no distension.     Palpations: Abdomen is soft.     Tenderness: There is no abdominal tenderness. There is no rebound.  Musculoskeletal:        General: Tenderness present.     Comments: Mild tenderness paraspinal lumbar region.  Skin:    General: Skin is warm and dry.  Neurological:     Mental Status: She is alert and oriented to person, place, and time.     Coordination: Coordination normal.     Vitals:   07/08/18 1302  BP: 138/84  Pulse: 97  Temp: 98.4 F (36.9 C)  TempSrc: Oral  SpO2: 98%    Weight: 123 lb (55.8 kg)  Height: 5\' 3"  (1.6 m)    Assessment & Plan:

## 2018-07-08 NOTE — Assessment & Plan Note (Signed)
No indication for imaging, overall improving. Advised to use aleve for pain or heat. Call back in 2-3 weeks if no improvement.

## 2018-08-12 ENCOUNTER — Telehealth: Payer: Self-pay | Admitting: Internal Medicine

## 2018-08-12 MED ORDER — FAMOTIDINE 20 MG PO TABS: 20.0000 mg | ORAL_TABLET | Freq: Every day | ORAL | 1 refills | Status: DC

## 2018-08-12 NOTE — Telephone Encounter (Signed)
I'm assuming this is just to replace ranitidine which is no longer available? Pepcid is very similar to this and we have sent in it. 1 pill daily in the evening. This is safer than protonix.

## 2018-08-12 NOTE — Telephone Encounter (Signed)
Patient informed of MD response and stated understanding  

## 2018-08-12 NOTE — Telephone Encounter (Signed)
Copied from [place] [id number]. Topic: General - Other >> Aug 12, 2018 11:36 AM Yvette Rack wrote: Reason for CRM: Pt stated she would like a new prescription for heartburn. Pt stated her husband takes Pantoprazole and she would like a prescription for it to be sent to [place], [place] [phone] (Phone)  [phone] (Fax)

## 2018-08-18 NOTE — Telephone Encounter (Signed)
Patient states she asked for Pantoprazole and not Pepcid. She said she does not understand why that was not sent in. Patient is aware of below message. She wants that called in to [facility]. She said that Pepcid does not work for her.

## 2018-08-18 NOTE — Telephone Encounter (Signed)
We would recommend to try pepcid for 2-3 weeks prior to switch. It is a much safer medication.

## 2018-08-18 NOTE — Telephone Encounter (Signed)
Called patient to inform that Dr. Sharlet Salina wants her to try the pepcid first as it is safer. Patient was confused to why it is safer to take. Patient was talking about how her and her husband share that medication and it works for her that is why she wants it. I explained that we do not recommend sharing medication as some medications may not be safe for both people. Patient stated that is was only a GERD medication and it is fine and if it is safe for her husband with heart problems than she should be safe. She stated she wanted the other medication sent into [facility]. I explained to patient that the medication is not going to be sent in just because she wants it to be sent in that Dr. Sharlet Salina wants her to try Pepcid first. Patient abruptly said okay fine and hung up

## 2018-09-15 ENCOUNTER — Telehealth: Payer: Self-pay | Admitting: *Deleted

## 2018-09-15 MED ORDER — PANTOPRAZOLE SODIUM 20 MG PO TBEC: 20.0000 mg | DELAYED_RELEASE_TABLET | Freq: Every day | ORAL | 0 refills | Status: DC

## 2018-09-15 NOTE — Telephone Encounter (Signed)
Should switch to pepcid over the counter (famotidine)

## 2018-09-15 NOTE — Telephone Encounter (Signed)
Rec fax stating Ranitidine is withdrawn from market. Requesting to change to Omeprazole, Pantoprazole or Esomeprazole. Please advise.

## 2018-09-15 NOTE — Telephone Encounter (Signed)
Sent in 90 day pantoprazole. Needs visit at 3 months to talk about benefit versus risk of this long term. She can also try pantoprazole daily for 1 month then retry pepcid.

## 2018-09-15 NOTE — Telephone Encounter (Signed)
Pt informed of below. She states she has already been taking Famotidine and it does not help at all. She still c/o heartburn and acid reflux with famotidine.  She is requesting Rx for pantoprazole as her husband has this and she has taken some before and this helped her sxs. Please advise.

## 2018-09-16 NOTE — Telephone Encounter (Signed)
Pt informed of below and verbalized understanding.  

## 2018-10-09 ENCOUNTER — Other Ambulatory Visit: Payer: Self-pay | Admitting: Internal Medicine

## 2018-10-30 NOTE — Progress Notes (Signed)
Subjective:   Gina King is a 73 y.o. female who presents for Medicare Annual (Subsequent) preventive examination. I connected with patient by a telephone and verified that I am speaking with the correct person using two identifiers. Patient stated full name and DOB. Patient gave permission to continue with telephonic visit. Patient's location was at home and Nurse's location was at [place] office.   Review of Systems:  No ROS.  Medicare Wellness Virtual Visit.  Visual/audio telehealth visit, UTA vital signs.   See social history for additional risk factors.  Cardiac Risk Factors include: advanced age (>22men, >56 women);dyslipidemia Sleep patterns: feels rested on waking, gets up 2 times nightly to void and sleeps 8 hours nightly.    Home Safety/Smoke Alarms: Feels safe in home. Smoke alarms in place.  Living environment; residence and Firearm Safety: 1-story house/ trailer. Seat Belt Safety/Bike Helmet: Wears seat belt.      Objective:     Vitals: There were no vitals taken for this visit.  There is no height or weight on file to calculate BMI.  Advanced Directives 10/31/2018 10/29/2017 03/04/2014 02/25/2014  Does Patient Have a Medical Advance Directive? No No No No  Does patient want to make changes to medical advance directive? No - Patient declined - - -  Would patient like information on creating a medical advance directive? - Yes (ED - Information included in AVS) No - patient declined information No - patient declined information    Tobacco Social History   Tobacco Use  Smoking Status Never Smoker  Smokeless Tobacco Never Used     Counseling given: Not Answered  Past Medical History:  Diagnosis Date  . GERD (gastroesophageal reflux disease)    Past Surgical History:  Procedure Laterality Date  . ABDOMINAL HYSTERECTOMY  1984   Family History  Problem Relation Age of Onset  . Kidney failure Brother   . Colon cancer Neg Hx    Social History   Socioeconomic  History  . Marital status: Married    Spouse name: Not on file  . Number of children: 2  . Years of education: 10 1/2 yrs  . Highest education level: Not on file  Occupational History  . Occupation: retired    Fish farm manager: Hospital doctor  Social Needs  . Financial resource strain: Not hard at all  . Food insecurity:    Worry: Never true    Inability: Never true  . Transportation needs:    Medical: No    Non-medical: No  Tobacco Use  . Smoking status: Never Smoker  . Smokeless tobacco: Never Used  Substance and Sexual Activity  . Alcohol use: No  . Drug use: No  . Sexual activity: Not Currently  Lifestyle  . Physical activity:    Days per week: 6 days    Minutes per session: 40 min  . Stress: Not at all  Relationships  . Social connections:    Talks on phone: More than three times a week    Gets together: More than three times a week    Attends religious service: More than 4 times per year    Active member of club or organization: Not on file    Attends meetings of clubs or organizations: More than 4 times per year    Relationship status: Married  Other Topics Concern  . Not on file  Social History Narrative  . Not on file    Outpatient Encounter Medications as of 10/31/2018  Medication Sig  .  atorvastatin (LIPITOR) 20 MG tablet TAKE 1 TABLET (20 MG TOTAL) BY MOUTH DAILY. NEED ANNUAL VISIT FOR FURTHER REFILLS  . cholecalciferol (VITAMIN D) 1000 UNITS tablet Take 1,000 Units by mouth daily.  . diphenhydrAMINE (SOMINEX) 25 MG tablet Take 25 mg by mouth at bedtime as needed for sleep.  . Multiple Vitamin (MULTIVITAMIN) tablet Take 1 tablet by mouth daily.  . pantoprazole (PROTONIX) 20 MG tablet Take 1 tablet (20 mg total) by mouth daily.  . vitamin B-12 (CYANOCOBALAMIN) 1000 MCG tablet Take 1,000 mcg by mouth daily. Reported on 11/07/2015  . vitamin C (ASCORBIC ACID) 500 MG tablet Take 500 mg by mouth 2 (two) times daily.   No facility-administered encounter medications on  file as of 10/31/2018.     Activities of Daily Living In your present state of health, do you have any difficulty performing the following activities: 10/31/2018  Hearing? N  Vision? N  Difficulty concentrating or making decisions? N  Walking or climbing stairs? N  Dressing or bathing? N  Doing errands, shopping? N  Preparing Food and eating ? N  Using the Toilet? N  In the past six months, have you accidently leaked urine? N  Do you have problems with loss of bowel control? N  Managing your Medications? N  Managing your Finances? N  Housekeeping or managing your Housekeeping? N  Some recent data might be hidden    Patient Care Team: Hoyt Koch, MD as PCP - General (Internal Medicine)    Assessment:   This is a routine wellness examination for Gina King. Physical assessment deferred to PCP.  Exercise Activities and Dietary recommendations Current Exercise Habits: Home exercise routine, Type of exercise: walking;stretching;calisthenics, Time (Minutes): 50, Frequency (Times/Week): 6, Weekly Exercise (Minutes/Week): 300, Intensity: Mild, Exercise limited by: None identified Diet (meal preparation, eat out, water intake, caffeinated beverages, dairy products, fruits and vegetables): in general, a "healthy" diet  , well balanced eats a variety of fruits and vegetables daily, limits salt, fat/cholesterol, sugar,carbohydrates,caffeine, drinks 6-8 glasses of water daily.  Goals    . Patient Stated     Maintain current health status by walking daily, eating healthy and enjoying life.       Fall Risk Fall Risk  10/31/2018 10/29/2017 11/07/2015  Falls in the past year? 0 No No  Number falls in past yr: 0 - -    Depression Screen PHQ 2/9 Scores 10/31/2018 10/29/2017 11/07/2015  PHQ - 2 Score 0 1 0  PHQ- 9 Score - 1 -     Cognitive Function       Ad8 score reviewed for issues:  Issues making decisions: no  Less interest in hobbies / activities: no  Repeats questions,  stories (family complaining): no  Trouble using ordinary gadgets (microwave, computer, phone):no  Forgets the month or year: no  Mismanaging finances: no  Remembering appts: no  Daily problems with thinking and/or memory: no Ad8 score is= 0  Immunization History  Administered Date(s) Administered  . Influenza Split 03/04/2013  . Influenza Whole 04/18/2010  . Influenza, High Dose Seasonal PF 02/13/2016  . Influenza, Seasonal, Injecte, Preservative Fre 03/04/2013  . Influenza,inj,Quad PF,6+ Mos 02/22/2014  . Influenza-Unspecified 03/18/2018  . Pneumococcal Conjugate-13 11/07/2015  . Pneumococcal Polysaccharide-23 02/22/2014  . Td 06/04/1997, 12/20/2008  . Zoster 11/07/2015   Screening Tests Health Maintenance  Topic Date Due  . TETANUS/TDAP  12/21/2018  . INFLUENZA VACCINE  01/03/2019  . MAMMOGRAM  05/05/2020  . COLONOSCOPY  03/04/2024  . DEXA SCAN  Completed  . Hepatitis C Screening  Completed  . PNA vac Low Risk Adult  Completed      Plan:    Reviewed health maintenance screenings with patient today and relevant education, vaccines, and/or referrals were provided.   Continue to eat heart healthy diet (full of fruits, vegetables, whole grains, lean protein, water--limit salt, fat, and sugar intake) and increase physical activity as tolerated.  Continue doing brain stimulating activities (puzzles, reading, adult coloring books, staying active) to keep memory sharp.   I have personally reviewed and noted the following in the patient's chart:   . Medical and social history . Use of alcohol, tobacco or illicit drugs  . Current medications and supplements . Functional ability and status . Nutritional status . Physical activity . Advanced directives . List of other physicians . Screenings to include cognitive, depression, and falls . Referrals and appointments  In addition, I have reviewed and discussed with patient certain preventive protocols, quality metrics,  and best practice recommendations. A written personalized care plan for preventive services as well as general preventive health recommendations were provided to patient.     Michiel Cowboy, RN  10/31/2018

## 2018-10-31 ENCOUNTER — Ambulatory Visit (INDEPENDENT_AMBULATORY_CARE_PROVIDER_SITE_OTHER): Payer: Medicare HMO | Admitting: *Deleted

## 2018-10-31 DIAGNOSIS — Z0000 Encounter for general adult medical examination without abnormal findings: Secondary | ICD-10-CM

## 2018-10-31 NOTE — Progress Notes (Signed)
Medical screening examination/treatment/procedure(s) were performed by non-physician practitioner and as supervising physician I was immediately available for consultation/collaboration. I agree with above. Elizabeth A Crawford, MD 

## 2018-12-16 ENCOUNTER — Other Ambulatory Visit (INDEPENDENT_AMBULATORY_CARE_PROVIDER_SITE_OTHER): Payer: Medicare HMO

## 2018-12-16 ENCOUNTER — Encounter: Payer: Self-pay | Admitting: Internal Medicine

## 2018-12-16 ENCOUNTER — Ambulatory Visit (INDEPENDENT_AMBULATORY_CARE_PROVIDER_SITE_OTHER): Payer: Medicare HMO | Admitting: Internal Medicine

## 2018-12-16 ENCOUNTER — Other Ambulatory Visit: Payer: Self-pay

## 2018-12-16 VITALS — BP 138/82 | HR 95 | Temp 98.2°F | Resp 16 | Ht 63.0 in | Wt 118.0 lb

## 2018-12-16 DIAGNOSIS — Z0000 Encounter for general adult medical examination without abnormal findings: Secondary | ICD-10-CM

## 2018-12-16 DIAGNOSIS — K219 Gastro-esophageal reflux disease without esophagitis: Secondary | ICD-10-CM

## 2018-12-16 DIAGNOSIS — E782 Mixed hyperlipidemia: Secondary | ICD-10-CM

## 2018-12-16 LAB — COMPREHENSIVE METABOLIC PANEL
ALT: 14 U/L (ref 0–35)
AST: 21 U/L (ref 0–37)
Albumin: 4.1 g/dL (ref 3.5–5.2)
Alkaline Phosphatase: 40 U/L (ref 39–117)
BUN: 10 mg/dL (ref 6–23)
CO2: 32 mEq/L (ref 19–32)
Calcium: 9 mg/dL (ref 8.4–10.5)
Chloride: 102 mEq/L (ref 96–112)
Creatinine, Ser: 0.98 mg/dL (ref 0.40–1.20)
GFR: 55.99 mL/min — ABNORMAL LOW (ref >60.00)
Glucose, Bld: 103 mg/dL — ABNORMAL HIGH (ref 70–99)
Potassium: 3.4 mEq/L — ABNORMAL LOW (ref 3.5–5.1)
Sodium: 140 mEq/L (ref 135–145)
Total Bilirubin: 0.4 mg/dL (ref 0.2–1.2)
Total Protein: 6.7 g/dL (ref 6.0–8.3)

## 2018-12-16 LAB — LIPID PANEL
Cholesterol: 169 mg/dL (ref 0–200)
HDL: 61.1 mg/dL (ref >39.00)
LDL Cholesterol: 87 mg/dL (ref 0–99)
NonHDL: 107.97
Total CHOL/HDL Ratio: 3
Triglycerides: 106 mg/dL (ref 0.0–149.0)
VLDL: 21.2 mg/dL (ref 0.0–40.0)

## 2018-12-16 LAB — CBC
HCT: 40.1 % (ref 36.0–46.0)
Hemoglobin: 13.4 g/dL (ref 12.0–15.0)
MCHC: 33.3 g/dL (ref 30.0–36.0)
MCV: 93.8 fl (ref 78.0–100.0)
Platelets: 251 10*3/uL (ref 150.0–400.0)
RBC: 4.27 Mil/uL (ref 3.87–5.11)
RDW: 13.4 % (ref 11.5–15.5)
WBC: 5.3 10*3/uL (ref 4.0–10.5)

## 2018-12-16 LAB — HEMOGLOBIN A1C: Hgb A1c MFr Bld: 5.7 % (ref 4.6–6.5)

## 2018-12-16 NOTE — Patient Instructions (Signed)
Health Maintenance, Female Adopting a healthy lifestyle and getting preventive care are important in promoting health and wellness. Ask your health care provider about:  The right schedule for you to have regular tests and exams.  Things you can do on your own to prevent diseases and keep yourself healthy. What should I know about diet, weight, and exercise? Eat a healthy diet   Eat a diet that includes plenty of vegetables, fruits, low-fat dairy products, and lean protein.  Do not eat a lot of foods that are high in solid fats, added sugars, or sodium. Maintain a healthy weight Body mass index (BMI) is used to identify weight problems. It estimates body fat based on height and weight. Your health care provider can help determine your BMI and help you achieve or maintain a healthy weight. Get regular exercise Get regular exercise. This is one of the most important things you can do for your health. Most adults should:  Exercise for at least 150 minutes each week. The exercise should increase your heart rate and make you sweat (moderate-intensity exercise).  Do strengthening exercises at least twice a week. This is in addition to the moderate-intensity exercise.  Spend less time sitting. Even light physical activity can be beneficial. Watch cholesterol and blood lipids Have your blood tested for lipids and cholesterol at 71 years of age, then have this test every 5 years. Have your cholesterol levels checked more often if:  Your lipid or cholesterol levels are high.  You are older than 71 years of age.  You are at high risk for heart disease. What should I know about cancer screening? Depending on your health history and family history, you may need to have cancer screening at various ages. This may include screening for:  Breast cancer.  Cervical cancer.  Colorectal cancer.  Skin cancer.  Lung cancer. What should I know about heart disease, diabetes, and high blood  pressure? Blood pressure and heart disease  High blood pressure causes heart disease and increases the risk of stroke. This is more likely to develop in people who have high blood pressure readings, are of African descent, or are overweight.  Have your blood pressure checked: ? Every 3-5 years if you are 18-39 years of age. ? Every year if you are 40 years old or older. Diabetes Have regular diabetes screenings. This checks your fasting blood sugar level. Have the screening done:  Once every three years after age 40 if you are at a normal weight and have a low risk for diabetes.  More often and at a younger age if you are overweight or have a high risk for diabetes. What should I know about preventing infection? Hepatitis B If you have a higher risk for hepatitis B, you should be screened for this virus. Talk with your health care provider to find out if you are at risk for hepatitis B infection. Hepatitis C Testing is recommended for:  Everyone born from 1945 through 1965.  Anyone with known risk factors for hepatitis C. Sexually transmitted infections (STIs)  Get screened for STIs, including gonorrhea and chlamydia, if: ? You are sexually active and are younger than 71 years of age. ? You are older than 71 years of age and your health care provider tells you that you are at risk for this type of infection. ? Your sexual activity has changed since you were last screened, and you are at increased risk for chlamydia or gonorrhea. Ask your health care provider if   you are at risk.  Ask your health care provider about whether you are at high risk for HIV. Your health care provider may recommend a prescription medicine to help prevent HIV infection. If you choose to take medicine to prevent HIV, you should first get tested for HIV. You should then be tested every 3 months for as long as you are taking the medicine. Pregnancy  If you are about to stop having your period (premenopausal) and  you may become pregnant, seek counseling before you get pregnant.  Take 400 to 800 micrograms (mcg) of folic acid every day if you become pregnant.  Ask for birth control (contraception) if you want to prevent pregnancy. Osteoporosis and menopause Osteoporosis is a disease in which the bones lose minerals and strength with aging. This can result in bone fractures. If you are 65 years old or older, or if you are at risk for osteoporosis and fractures, ask your health care provider if you should:  Be screened for bone loss.  Take a calcium or vitamin D supplement to lower your risk of fractures.  Be given hormone replacement therapy (HRT) to treat symptoms of menopause. Follow these instructions at home: Lifestyle  Do not use any products that contain nicotine or tobacco, such as cigarettes, e-cigarettes, and chewing tobacco. If you need help quitting, ask your health care provider.  Do not use street drugs.  Do not share needles.  Ask your health care provider for help if you need support or information about quitting drugs. Alcohol use  Do not drink alcohol if: ? Your health care provider tells you not to drink. ? You are pregnant, may be pregnant, or are planning to become pregnant.  If you drink alcohol: ? Limit how much you use to 0-1 drink a day. ? Limit intake if you are breastfeeding.  Be aware of how much alcohol is in your drink. In the [place] one drink equals one 12 oz bottle of beer (355 mL), one 5 oz glass of wine (148 mL), or one 1 oz glass of hard liquor (44 mL). General instructions  Schedule regular health, dental, and eye exams.  Stay current with your vaccines.  Tell your health care provider if: ? You often feel depressed. ? You have ever been abused or do not feel safe at home. Summary  Adopting a healthy lifestyle and getting preventive care are important in promoting health and wellness.  Follow your health care provider's instructions about healthy  diet, exercising, and getting tested or screened for diseases.  Follow your health care provider's instructions on monitoring your cholesterol and blood pressure. This information is not intended to replace advice given to you by your health care provider. Make sure you discuss any questions you have with your health care provider. Document Released: 12/04/2010 Document Revised: 05/14/2018 Document Reviewed: 05/14/2018 Elsevier Patient Education  2020 [employer] Inc[employer]  

## 2018-12-16 NOTE — Assessment & Plan Note (Signed)
Flu shot counseled yearly. Pneumonia complete. Shingrix counseled. Tetanus counseled declines due to cost. Colonoscopy up to date. Mammogram up to date, pap smear aged out and dexa up to date. Counseled about sun safety and mole surveillance. Counseled about the dangers of distracted driving. Given 10 year screening recommendations.

## 2018-12-16 NOTE — Assessment & Plan Note (Signed)
Checking lipid panel and adjust lipitor 20 mg daily.  

## 2018-12-16 NOTE — Progress Notes (Signed)
   Subjective:   Patient ID: Gina King, female    DOB: 05-20-1946, 73 y.o.   MRN: [id number]  HPI The patient is a 73 YO female coming in for physical.   PMH, [place], social history reviewed and updated   Review of Systems  Constitutional: Negative.   HENT: Negative.   Eyes: Negative.   Respiratory: Negative for cough, chest tightness and shortness of breath.   Cardiovascular: Negative for chest pain, palpitations and leg swelling.  Gastrointestinal: Negative for abdominal distention, abdominal pain, constipation, diarrhea, nausea and vomiting.  Musculoskeletal: Negative.   Skin: Negative.   Neurological: Negative.   Psychiatric/Behavioral: Negative.     Objective:  Physical Exam Constitutional:      Appearance: She is well-developed.  HENT:     Head: Normocephalic and atraumatic.  Neck:     Musculoskeletal: Normal range of motion.  Cardiovascular:     Rate and Rhythm: Normal rate and regular rhythm.  Pulmonary:     Effort: Pulmonary effort is normal. No respiratory distress.     Breath sounds: Normal breath sounds. No wheezing or rales.  Abdominal:     General: Bowel sounds are normal. There is no distension.     Palpations: Abdomen is soft.     Tenderness: There is no abdominal tenderness. There is no rebound.  Skin:    General: Skin is warm and dry.  Neurological:     Mental Status: She is alert and oriented to person, place, and time.     Coordination: Coordination normal.     Vitals:   12/16/18 1442  BP: 138/82  Pulse: 95  Resp: 16  Temp: 98.2 F (36.8 C)  TempSrc: Oral  SpO2: 97%  Weight: 118 lb (53.5 kg)  Height: 5\' 3"  (1.6 m)    Assessment & Plan:

## 2018-12-16 NOTE — Assessment & Plan Note (Signed)
Stable on protonix and doing well. Talked about potential long term effects.

## 2019-04-14 ENCOUNTER — Other Ambulatory Visit: Payer: Self-pay | Admitting: Internal Medicine

## 2019-04-14 DIAGNOSIS — Z1231 Encounter for screening mammogram for malignant neoplasm of breast: Secondary | ICD-10-CM

## 2019-06-08 ENCOUNTER — Other Ambulatory Visit: Payer: Self-pay

## 2019-06-08 ENCOUNTER — Ambulatory Visit
Admission: RE | Admit: 2019-06-08 | Discharge: 2019-06-08 | Disposition: A | Discharge status: Home / Self Care | Payer: Medicare HMO | Source: Ambulatory Visit | Attending: Internal Medicine | Admitting: Internal Medicine

## 2019-06-08 DIAGNOSIS — Z1231 Encounter for screening mammogram for malignant neoplasm of breast: Secondary | ICD-10-CM

## 2019-06-16 ENCOUNTER — Encounter: Payer: Self-pay | Admitting: Internal Medicine

## 2019-06-16 ENCOUNTER — Other Ambulatory Visit: Payer: Self-pay

## 2019-06-16 ENCOUNTER — Ambulatory Visit (INDEPENDENT_AMBULATORY_CARE_PROVIDER_SITE_OTHER): Payer: Medicare HMO | Admitting: Internal Medicine

## 2019-06-16 VITALS — BP 134/86 | HR 80 | Temp 98.0°F | Ht 63.0 in | Wt 114.0 lb

## 2019-06-16 DIAGNOSIS — R21 Rash and other nonspecific skin eruption: Secondary | ICD-10-CM | POA: Diagnosis not present

## 2019-06-16 MED ORDER — TRIAMCINOLONE ACETONIDE 0.1 % EX CREA: 1.0000 "application " | TOPICAL_CREAM | Freq: Two times a day (BID) | CUTANEOUS | 2 refills | Status: DC

## 2019-06-16 NOTE — Progress Notes (Signed)
   Subjective:   Patient ID: Gina King, female    DOB: 11/07/1945, 74 y.o.   MRN: [id number]  HPI The patient is a 74 YO female coming in for rash on her neck. Started about 1-2 weeks ago. Denies known change to medications, foods, detergent, soaps around that time. Has been using retinol cream which is new but this was used on the face and neck but not in the area of the rash. She did try some otc creams which helped. Was itching quite a lot. Did hurt when she was scratching a little. Has not been scratching. No pain now and itching is decreasing. Rash is overall improving but not gone yet. No live christmas tree this year.   Review of Systems  Constitutional: Negative.   HENT: Negative.   Eyes: Negative.   Respiratory: Negative for cough, chest tightness and shortness of breath.   Cardiovascular: Negative for chest pain, palpitations and leg swelling.  Gastrointestinal: Negative for abdominal distention, abdominal pain, constipation, diarrhea, nausea and vomiting.  Musculoskeletal: Negative.   Skin: Positive for rash.  Neurological: Negative.   Psychiatric/Behavioral: Negative.     Objective:  Physical Exam Constitutional:      Appearance: She is well-developed.  HENT:     Head: Normocephalic and atraumatic.  Cardiovascular:     Rate and Rhythm: Normal rate and regular rhythm.  Pulmonary:     Effort: Pulmonary effort is normal. No respiratory distress.     Breath sounds: Normal breath sounds. No wheezing or rales.  Abdominal:     General: Bowel sounds are normal. There is no distension.     Palpations: Abdomen is soft.     Tenderness: There is no abdominal tenderness. There is no rebound.  Musculoskeletal:     Cervical back: Normal range of motion.  Skin:    General: Skin is warm and dry.     Findings: Rash present.     Comments: Rash on the clavicular area left and right, not consistent with shingles, no satellite lesions  Neurological:     Mental Status: She is alert  and oriented to person, place, and time.     Coordination: Coordination normal.     Vitals:   06/16/19 1535  BP: 134/86  Pulse: 80  Temp: 98 F (36.7 C)  TempSrc: Oral  SpO2: 96%  Weight: 114 lb (51.7 kg)  Height: 5\' 3"  (1.6 m)    This visit occurred during the SARS-CoV-2 public health emergency.  Safety protocols were in place, including screening questions prior to the visit, additional usage of staff PPE, and extensive cleaning of exam room while observing appropriate contact time as indicated for disinfecting solutions.   Assessment & Plan:

## 2019-06-16 NOTE — Assessment & Plan Note (Signed)
Rx triamcinolone ointment to use for rash. No known trigger. Advised if recurrence we need to do more investigation for trigger.

## 2019-06-16 NOTE — Patient Instructions (Signed)
We have sent in triamcinolone ointment to use twice a day until rash gone.   If this comes back let us know.

## 2019-07-31 ENCOUNTER — Ambulatory Visit: Payer: Medicare HMO | Attending: Internal Medicine

## 2019-07-31 DIAGNOSIS — Z23 Encounter for immunization: Secondary | ICD-10-CM | POA: Insufficient documentation

## 2019-07-31 NOTE — Progress Notes (Signed)
   Covid-19 Vaccination Clinic  Name:  Gina King    MRN: [id number] DOB: 1945/09/17  07/31/2019  Gina King was observed post Covid-19 immunization for 15 minutes without incidence. She was provided with Vaccine Information Sheet and instruction to access the V-Safe system.   Gina King was instructed to call 911 with any severe reactions post vaccine: Marland Kitchen Difficulty breathing  . Swelling of your face and throat  . A fast heartbeat  . A bad rash all over your body  . Dizziness and weakness    Immunizations Administered    Name Date Dose VIS Date Route   Pfizer COVID-19 Vaccine 07/31/2019  1:56 PM 0.3 mL 05/15/2019 Intramuscular   Manufacturer: [place]   Lot: [id number]   [place]: [id number]

## 2019-08-26 ENCOUNTER — Ambulatory Visit: Payer: Medicare HMO | Attending: Internal Medicine

## 2019-08-26 DIAGNOSIS — Z23 Encounter for immunization: Secondary | ICD-10-CM

## 2019-08-26 NOTE — Progress Notes (Signed)
   Covid-19 Vaccination Clinic  Name:  Gina King    MRN: [id number] DOB: [birth year]  08/26/2019  Gina King was observed post Covid-19 immunization for 15 minutes without incident. She was provided with Vaccine Information Sheet and instruction to access the V-Safe system.   Gina King was instructed to call 911 with any severe reactions post vaccine: Marland Kitchen Difficulty breathing  . Swelling of face and throat  . A fast heartbeat  . A bad rash all over body  . Dizziness and weakness   Immunizations Administered    Name Date Dose VIS Date Route   Pfizer COVID-19 Vaccine 08/26/2019  9:44 AM 0.3 mL 05/15/2019 Intramuscular   Manufacturer: [place]   Lot: [id number]   [place]: [id number]

## 2019-11-03 ENCOUNTER — Ambulatory Visit: Payer: Medicare HMO

## 2020-02-03 ENCOUNTER — Other Ambulatory Visit: Payer: Self-pay

## 2020-02-03 MED ORDER — PANTOPRAZOLE SODIUM 20 MG PO TBEC: 20.0000 mg | DELAYED_RELEASE_TABLET | Freq: Every day | ORAL | 0 refills | Status: DC

## 2020-03-30 ENCOUNTER — Other Ambulatory Visit: Payer: Self-pay | Admitting: Internal Medicine

## 2020-03-30 NOTE — Telephone Encounter (Signed)
Please refill as per office routine med refill policy (all routine meds refilled for 3 mo or monthly per pt preference up to one year from last visit, then month to month grace period for 3 mo, then further med refills will have to be denied)  

## 2020-06-13 ENCOUNTER — Other Ambulatory Visit: Payer: Self-pay | Admitting: Internal Medicine

## 2020-06-13 NOTE — Telephone Encounter (Signed)
Please refill as per office routine med refill policy (all routine meds refilled for 3 mo or monthly per pt preference up to one year from last visit, then month to month grace period for 3 mo, then further med refills will have to be denied)  

## 2020-06-24 ENCOUNTER — Other Ambulatory Visit: Payer: Self-pay | Admitting: Internal Medicine

## 2020-06-24 DIAGNOSIS — Z1231 Encounter for screening mammogram for malignant neoplasm of breast: Secondary | ICD-10-CM

## 2020-06-27 ENCOUNTER — Other Ambulatory Visit: Payer: Self-pay

## 2020-06-27 ENCOUNTER — Ambulatory Visit
Admission: RE | Admit: 2020-06-27 | Discharge: 2020-06-27 | Disposition: A | Discharge status: Home / Self Care | Payer: Medicare HMO | Source: Ambulatory Visit | Attending: Internal Medicine | Admitting: Internal Medicine

## 2020-06-27 DIAGNOSIS — Z1231 Encounter for screening mammogram for malignant neoplasm of breast: Secondary | ICD-10-CM

## 2020-08-29 ENCOUNTER — Other Ambulatory Visit: Payer: Self-pay | Admitting: Internal Medicine

## 2020-08-29 ENCOUNTER — Encounter: Payer: Self-pay | Admitting: Internal Medicine

## 2020-08-29 ENCOUNTER — Other Ambulatory Visit: Payer: Self-pay

## 2020-08-29 ENCOUNTER — Ambulatory Visit (INDEPENDENT_AMBULATORY_CARE_PROVIDER_SITE_OTHER): Payer: Medicare HMO | Admitting: Internal Medicine

## 2020-08-29 VITALS — BP 122/66 | HR 82 | Temp 98.9°F | Resp 18 | Ht 63.0 in | Wt 103.2 lb

## 2020-08-29 DIAGNOSIS — Z0000 Encounter for general adult medical examination without abnormal findings: Secondary | ICD-10-CM | POA: Diagnosis not present

## 2020-08-29 DIAGNOSIS — M8580 Other specified disorders of bone density and structure, unspecified site: Secondary | ICD-10-CM | POA: Diagnosis not present

## 2020-08-29 DIAGNOSIS — E2839 Other primary ovarian failure: Secondary | ICD-10-CM

## 2020-08-29 DIAGNOSIS — K219 Gastro-esophageal reflux disease without esophagitis: Secondary | ICD-10-CM | POA: Diagnosis not present

## 2020-08-29 DIAGNOSIS — E782 Mixed hyperlipidemia: Secondary | ICD-10-CM

## 2020-08-29 LAB — LIPID PANEL
Cholesterol: 238 mg/dL — ABNORMAL HIGH (ref 0–200)
HDL: 60.5 mg/dL (ref >39.00)
LDL Cholesterol: 143 mg/dL — ABNORMAL HIGH (ref 0–99)
NonHDL: 177.04
Total CHOL/HDL Ratio: 4
Triglycerides: 169 mg/dL — ABNORMAL HIGH (ref 0.0–149.0)
VLDL: 33.8 mg/dL (ref 0.0–40.0)

## 2020-08-29 LAB — COMPREHENSIVE METABOLIC PANEL
ALT: 14 U/L (ref 0–35)
AST: 18 U/L (ref 0–37)
Albumin: 4.2 g/dL (ref 3.5–5.2)
Alkaline Phosphatase: 37 U/L — ABNORMAL LOW (ref 39–117)
BUN: 13 mg/dL (ref 6–23)
CO2: 30 mEq/L (ref 19–32)
Calcium: 10 mg/dL (ref 8.4–10.5)
Chloride: 101 mEq/L (ref 96–112)
Creatinine, Ser: 0.88 mg/dL (ref 0.40–1.20)
GFR: 65.65 mL/min (ref >60.00)
Glucose, Bld: 99 mg/dL (ref 70–99)
Potassium: 4.9 mEq/L (ref 3.5–5.1)
Sodium: 138 mEq/L (ref 135–145)
Total Bilirubin: 0.4 mg/dL (ref 0.2–1.2)
Total Protein: 6.8 g/dL (ref 6.0–8.3)

## 2020-08-29 LAB — CBC
HCT: 41.7 % (ref 36.0–46.0)
Hemoglobin: 14.3 g/dL (ref 12.0–15.0)
MCHC: 34.3 g/dL (ref 30.0–36.0)
MCV: 92.5 fl (ref 78.0–100.0)
Platelets: 245 10*3/uL (ref 150.0–400.0)
RBC: 4.5 Mil/uL (ref 3.87–5.11)
RDW: 13 % (ref 11.5–15.5)
WBC: 5.5 10*3/uL (ref 4.0–10.5)

## 2020-08-29 NOTE — Assessment & Plan Note (Signed)
Checking bone density for follow up.

## 2020-08-29 NOTE — Progress Notes (Signed)
   Subjective:   Patient ID: Gina King, female    DOB: April 05, 1946, 75 y.o.   MRN: [id number]  HPI Here for medicare wellness and physical, no new complaints. Please see A/P for status and treatment of chronic medical problems.   Diet: heart healthy Physical activity: active Depression/mood screen: negative Hearing: intact to whispered voice Visual acuity: grossly normal, performs annual eye exam  ADLs: capable Fall risk: none Home safety: good Cognitive evaluation: intact to orientation, naming, recall and repetition EOL planning: adv directives discussed  [place] Visit from 08/29/2020 in [place] [facility]  PHQ-2 Total Score 0      [place] from 10/29/2017 in [place]  PHQ-9 Total Score 1      I have personally reviewed and have noted 1. The patient's medical and social history - reviewed today no changes 2. Their use of alcohol, tobacco or illicit drugs 3. Their current medications and supplements 4. The patient's functional ability including ADL's, fall risks, home safety risks and hearing or visual impairment. 5. Diet and physical activities 6. Evidence for depression or mood disorders 7. Care team reviewed and updated 8.  The patient is not on an opioid pain medication.  Patient Care Team: Hoyt Koch, MD as PCP - General (Internal Medicine) Past Medical History:  Diagnosis Date  . GERD (gastroesophageal reflux disease)    Past Surgical History:  Procedure Laterality Date  . ABDOMINAL HYSTERECTOMY  1984   Family History  Problem Relation Age of Onset  . Kidney failure Brother   . Colon cancer Neg Hx    Review of Systems  Constitutional: Negative.   HENT: Negative.   Eyes: Negative.   Respiratory: Negative for cough, chest tightness and shortness of breath.   Cardiovascular: Negative for chest pain, palpitations and leg swelling.  Gastrointestinal: Negative for  abdominal distention, abdominal pain, constipation, diarrhea, nausea and vomiting.  Musculoskeletal: Negative.   Skin: Negative.   Neurological: Negative.   Psychiatric/Behavioral: Negative.     Objective:  Physical Exam Constitutional:      Appearance: She is well-developed.  HENT:     Head: Normocephalic and atraumatic.  Cardiovascular:     Rate and Rhythm: Normal rate and regular rhythm.  Pulmonary:     Effort: Pulmonary effort is normal. No respiratory distress.     Breath sounds: Normal breath sounds. No wheezing or rales.  Abdominal:     General: Bowel sounds are normal. There is no distension.     Palpations: Abdomen is soft.     Tenderness: There is no abdominal tenderness. There is no rebound.  Musculoskeletal:     Cervical back: Normal range of motion.  Skin:    General: Skin is warm and dry.  Neurological:     Mental Status: She is alert and oriented to person, place, and time.     Coordination: Coordination normal.     Vitals:   08/29/20 1314  BP: 122/66  Pulse: 82  Resp: 18  Temp: 98.9 F (37.2 C)  TempSrc: Oral  SpO2: 98%  Weight: 103 lb 3.2 oz (46.8 kg)  Height: 5\' 3"  (1.6 m)   This visit occurred during the SARS-CoV-2 public health emergency.  Safety protocols were in place, including screening questions prior to the visit, additional usage of staff PPE, and extensive cleaning of exam room while observing appropriate contact time as indicated for disinfecting solutions.   Assessment & Plan:

## 2020-08-29 NOTE — Assessment & Plan Note (Signed)
Flu shot up to date. Covid-19 up to date. Pneumonia complete. Shingrix counseled but has had zostavax. Tetanus due advised to get at pharmacy. Colonoscopy due 2025. Mammogram due 2024, pap smear aged out and dexa ordered today. Counseled about sun safety and mole surveillance. Counseled about the dangers of distracted driving. Given 10 year screening recommendations.

## 2020-08-29 NOTE — Assessment & Plan Note (Signed)
Checking lipid panel but previously controlled with diet.

## 2020-08-29 NOTE — Assessment & Plan Note (Signed)
Uses protonix prn and rarely. Refill when needed.

## 2020-08-29 NOTE — Patient Instructions (Addendum)
Ask the pharmacist about the shingrix vaccine.   Health Maintenance, Female Adopting a healthy lifestyle and getting preventive care are important in promoting health and wellness. Ask your health care provider about:  The right schedule for you to have regular tests and exams.  Things you can do on your own to prevent diseases and keep yourself healthy. What should I know about diet, weight, and exercise? Eat a healthy diet  Eat a diet that includes plenty of vegetables, fruits, low-fat dairy products, and lean protein.  Do not eat a lot of foods that are high in solid fats, added sugars, or sodium.   Maintain a healthy weight Body mass index (BMI) is used to identify weight problems. It estimates body fat based on height and weight. Your health care provider can help determine your BMI and help you achieve or maintain a healthy weight. Get regular exercise Get regular exercise. This is one of the most important things you can do for your health. Most adults should:  Exercise for at least 150 minutes each week. The exercise should increase your heart rate and make you sweat (moderate-intensity exercise).  Do strengthening exercises at least twice a week. This is in addition to the moderate-intensity exercise.  Spend less time sitting. Even light physical activity can be beneficial. Watch cholesterol and blood lipids Have your blood tested for lipids and cholesterol at 73 years of age, then have this test every 5 years. Have your cholesterol levels checked more often if:  Your lipid or cholesterol levels are high.  You are older than 73 years of age.  You are at high risk for heart disease. What should I know about cancer screening? Depending on your health history and family history, you may need to have cancer screening at various ages. This may include screening for:  Breast cancer.  Cervical cancer.  Colorectal cancer.  Skin cancer.  Lung cancer. What should I know  about heart disease, diabetes, and high blood pressure? Blood pressure and heart disease  High blood pressure causes heart disease and increases the risk of stroke. This is more likely to develop in people who have high blood pressure readings, are of African descent, or are overweight.  Have your blood pressure checked: ? Every 3-5 years if you are 70-36 years of age. ? Every year if you are 57 years old or older. Diabetes Have regular diabetes screenings. This checks your fasting blood sugar level. Have the screening done:  Once every three years after age 19 if you are at a normal weight and have a low risk for diabetes.  More often and at a younger age if you are overweight or have a high risk for diabetes. What should I know about preventing infection? Hepatitis B If you have a higher risk for hepatitis B, you should be screened for this virus. Talk with your health care provider to find out if you are at risk for hepatitis B infection. Hepatitis C Testing is recommended for:  Everyone born from [birth year] through 1965.  Anyone with known risk factors for hepatitis C. Sexually transmitted infections (STIs)  Get screened for STIs, including gonorrhea and chlamydia, if: ? You are sexually active and are younger than 73 years of age. ? You are older than 73 years of age and your health care provider tells you that you are at risk for this type of infection. ? Your sexual activity has changed since you were last screened, and you are at increased risk  for chlamydia or gonorrhea. Ask your health care provider if you are at risk.  Ask your health care provider about whether you are at high risk for HIV. Your health care provider may recommend a prescription medicine to help prevent HIV infection. If you choose to take medicine to prevent HIV, you should first get tested for HIV. You should then be tested every 3 months for as long as you are taking the medicine. Pregnancy  If you are about  to stop having your period (premenopausal) and you may become pregnant, seek counseling before you get pregnant.  Take 400 to 800 micrograms (mcg) of folic acid every day if you become pregnant.  Ask for birth control (contraception) if you want to prevent pregnancy. Osteoporosis and menopause Osteoporosis is a disease in which the bones lose minerals and strength with aging. This can result in bone fractures. If you are 59 years old or older, or if you are at risk for osteoporosis and fractures, ask your health care provider if you should:  Be screened for bone loss.  Take a calcium or vitamin D supplement to lower your risk of fractures.  Be given hormone replacement therapy (HRT) to treat symptoms of menopause. Follow these instructions at home: Lifestyle  Do not use any products that contain nicotine or tobacco, such as cigarettes, e-cigarettes, and chewing tobacco. If you need help quitting, ask your health care provider.  Do not use street drugs.  Do not share needles.  Ask your health care provider for help if you need support or information about quitting drugs. Alcohol use  Do not drink alcohol if: ? Your health care provider tells you not to drink. ? You are pregnant, may be pregnant, or are planning to become pregnant.  If you drink alcohol: ? Limit how much you use to 0-1 drink a day. ? Limit intake if you are breastfeeding.  Be aware of how much alcohol is in your drink. In the [place] one drink equals one 12 oz bottle of beer (355 mL), one 5 oz glass of wine (148 mL), or one 1 oz glass of hard liquor (44 mL). General instructions  Schedule regular health, dental, and eye exams.  Stay current with your vaccines.  Tell your health care provider if: ? You often feel depressed. ? You have ever been abused or do not feel safe at home. Summary  Adopting a healthy lifestyle and getting preventive care are important in promoting health and wellness.  Follow your  health care provider's instructions about healthy diet, exercising, and getting tested or screened for diseases.  Follow your health care provider's instructions on monitoring your cholesterol and blood pressure. This information is not intended to replace advice given to you by your health care provider. Make sure you discuss any questions you have with your health care provider. Document Revised: 05/14/2018 Document Reviewed: 05/14/2018 Elsevier Patient Education  2021 [employer].

## 2020-08-30 NOTE — Telephone Encounter (Signed)
To pcp

## 2020-09-05 ENCOUNTER — Other Ambulatory Visit: Payer: Self-pay

## 2020-09-05 ENCOUNTER — Ambulatory Visit (INDEPENDENT_AMBULATORY_CARE_PROVIDER_SITE_OTHER)
Admission: RE | Admit: 2020-09-05 | Discharge: 2020-09-05 | Disposition: A | Discharge status: Home / Self Care | Payer: Medicare HMO | Source: Ambulatory Visit

## 2020-09-05 DIAGNOSIS — E2839 Other primary ovarian failure: Secondary | ICD-10-CM

## 2020-11-28 ENCOUNTER — Encounter: Payer: Self-pay | Admitting: Internal Medicine

## 2020-11-28 ENCOUNTER — Ambulatory Visit (INDEPENDENT_AMBULATORY_CARE_PROVIDER_SITE_OTHER): Payer: Medicare HMO | Admitting: Internal Medicine

## 2020-11-28 ENCOUNTER — Other Ambulatory Visit: Payer: Self-pay

## 2020-11-28 VITALS — BP 122/80 | HR 80 | Temp 98.3°F | Resp 18 | Ht 63.0 in | Wt 101.8 lb

## 2020-11-28 DIAGNOSIS — R202 Paresthesia of skin: Secondary | ICD-10-CM | POA: Diagnosis not present

## 2020-11-28 DIAGNOSIS — R200 Anesthesia of skin: Secondary | ICD-10-CM | POA: Diagnosis not present

## 2020-11-28 LAB — VITAMIN B12: Vitamin B-12: >1550 pg/mL — ABNORMAL HIGH (ref 211–911)

## 2020-11-28 LAB — VITAMIN D 25 HYDROXY (VIT D DEFICIENCY, FRACTURES): VITD: 37.13 ng/mL (ref 30.00–100.00)

## 2020-11-28 LAB — TSH: TSH: 0.83 u[IU]/mL (ref 0.35–4.50)

## 2020-11-28 NOTE — Progress Notes (Signed)
   Subjective:   Patient ID: Gina King, female    DOB: 12-30-1945, 75 y.o.   MRN: [id number]  HPI The patient is a 75 YO female coming in for tingling in the left thigh region. Off and on for weeks at least maybe months. No real triggers. Sometimes if she sits for too long this can happen. Intermittent pain in the same region. No injury. No back pain. Denies trying anything for this. No weakness in the leg or falls.   Review of Systems  Constitutional: Negative.   HENT: Negative.    Eyes: Negative.   Respiratory:  Negative for cough, chest tightness and shortness of breath.   Cardiovascular:  Negative for chest pain, palpitations and leg swelling.  Gastrointestinal:  Negative for abdominal distention, abdominal pain, constipation, diarrhea, nausea and vomiting.  Musculoskeletal: Negative.   Skin: Negative.   Neurological:  Positive for numbness.  Psychiatric/Behavioral: Negative.     Objective:  Physical Exam Constitutional:      Appearance: She is well-developed.  HENT:     Head: Normocephalic and atraumatic.  Cardiovascular:     Rate and Rhythm: Normal rate and regular rhythm.  Pulmonary:     Effort: Pulmonary effort is normal. No respiratory distress.     Breath sounds: Normal breath sounds. No wheezing or rales.  Abdominal:     General: Bowel sounds are normal. There is no distension.     Palpations: Abdomen is soft.     Tenderness: There is no abdominal tenderness. There is no rebound.  Musculoskeletal:     Cervical back: Normal range of motion.  Skin:    General: Skin is warm and dry.  Neurological:     Mental Status: She is alert and oriented to person, place, and time.     Cranial Nerves: No cranial nerve deficit.     Motor: No weakness.     Coordination: Coordination normal.     Comments: Intact sensation right and left LE, 4/5 strength bil LE    Vitals:   11/28/20 1103  BP: 122/80  Pulse: 80  Resp: 18  Temp: 98.3 F (36.8 C)  TempSrc: Oral  SpO2: 97%   Weight: 101 lb 12.8 oz (46.2 kg)  Height: 5\' 3"  (1.6 m)    This visit occurred during the SARS-CoV-2 public health emergency.  Safety protocols were in place, including screening questions prior to the visit, additional usage of staff PPE, and extensive cleaning of exam room while observing appropriate contact time as indicated for disinfecting solutions.   Assessment & Plan:

## 2020-11-28 NOTE — Patient Instructions (Signed)
We will check the labs today. 

## 2020-11-30 DIAGNOSIS — R202 Paresthesia of skin: Secondary | ICD-10-CM | POA: Insufficient documentation

## 2020-11-30 DIAGNOSIS — R200 Anesthesia of skin: Secondary | ICD-10-CM

## 2020-11-30 HISTORY — DX: Paresthesia of skin: R20.0

## 2020-11-30 NOTE — Assessment & Plan Note (Signed)
Checking TSH, B12, vitamin D to rule out metabolic cause. Offered x-ray low back to exclude radiculopathy but she declines today.

## 2020-12-07 ENCOUNTER — Other Ambulatory Visit: Payer: Self-pay | Admitting: Internal Medicine

## 2021-06-23 ENCOUNTER — Ambulatory Visit (INDEPENDENT_AMBULATORY_CARE_PROVIDER_SITE_OTHER): Payer: Medicare HMO | Admitting: Internal Medicine

## 2021-06-23 ENCOUNTER — Encounter: Payer: Self-pay | Admitting: Internal Medicine

## 2021-06-23 ENCOUNTER — Other Ambulatory Visit: Payer: Self-pay

## 2021-06-23 VITALS — BP 124/62 | HR 78 | Temp 98.7°F | Ht 63.0 in | Wt 112.0 lb

## 2021-06-23 DIAGNOSIS — G2581 Restless legs syndrome: Secondary | ICD-10-CM

## 2021-06-23 DIAGNOSIS — R413 Other amnesia: Secondary | ICD-10-CM | POA: Diagnosis not present

## 2021-06-23 HISTORY — DX: Restless legs syndrome: G25.81

## 2021-06-23 LAB — CBC
HCT: 42.2 % (ref 36.0–46.0)
Hemoglobin: 13.9 g/dL (ref 12.0–15.0)
MCHC: 33 g/dL (ref 30.0–36.0)
MCV: 93.8 fl (ref 78.0–100.0)
Platelets: 258 10*3/uL (ref 150.0–400.0)
RBC: 4.49 Mil/uL (ref 3.87–5.11)
RDW: 13.4 % (ref 11.5–15.5)
WBC: 5.7 10*3/uL (ref 4.0–10.5)

## 2021-06-23 LAB — FERRITIN: Ferritin: 36.6 ng/mL (ref 10.0–291.0)

## 2021-06-23 NOTE — Progress Notes (Signed)
° °  Subjective:   Patient ID: Gina King, female    DOB: [birth year], 76 y.o.   MRN: [id number]  HPI The patient is a 76 YO female coming in for several concerns.   Review of Systems  Constitutional: Negative.   HENT: Negative.    Eyes: Negative.   Respiratory:  Negative for cough, chest tightness and shortness of breath.   Cardiovascular:  Negative for chest pain, palpitations and leg swelling.  Gastrointestinal:  Negative for abdominal distention, abdominal pain, constipation, diarrhea, nausea and vomiting.  Musculoskeletal: Negative.   Skin: Negative.   Neurological: Negative.        Memory change  Psychiatric/Behavioral: Negative.     Objective:  Physical Exam Constitutional:      Appearance: She is well-developed.  HENT:     Head: Normocephalic and atraumatic.  Cardiovascular:     Rate and Rhythm: Normal rate and regular rhythm.  Pulmonary:     Effort: Pulmonary effort is normal. No respiratory distress.     Breath sounds: Normal breath sounds. No wheezing or rales.  Abdominal:     General: Bowel sounds are normal. There is no distension.     Palpations: Abdomen is soft.     Tenderness: There is no abdominal tenderness. There is no rebound.  Musculoskeletal:     Cervical back: Normal range of motion.  Skin:    General: Skin is warm and dry.  Neurological:     Mental Status: She is alert and oriented to person, place, and time.     Coordination: Coordination normal.    Vitals:   06/23/21 1513  BP: 124/62  Pulse: 78  Temp: 98.7 F (37.1 C)  TempSrc: Oral  SpO2: 98%  Weight: 112 lb (50.8 kg)  Height: 5\' 3"  (1.6 m)    This visit occurred during the SARS-CoV-2 public health emergency.  Safety protocols were in place, including screening questions prior to the visit, additional usage of staff PPE, and extensive cleaning of exam room while observing appropriate contact time as indicated for disinfecting solutions.   Assessment & Plan:  Visit time 30 minutes in  face to face communication with patient and coordination of care, additional 5 minutes spent in record review, coordination or care, ordering tests, communicating/referring to other healthcare professionals, documenting in medical records all on the same day of the visit for total time 35 minutes spent on the visit.

## 2021-06-23 NOTE — Patient Instructions (Signed)
We will do the MRI of the brain.  We will check the labs and we will get you in with the memory specialist.

## 2021-06-23 NOTE — Assessment & Plan Note (Signed)
Checking CBC and ferritin. It is hard to tell from description if this represents true restless leg. She is waking up and then some leg shaking. She states improved would not start medication for this at this time.

## 2021-06-23 NOTE — Assessment & Plan Note (Signed)
Husband present at visit and has concerns about her memory and she does deny this some. They agree to MRI brain and neuropsych testing for some objective data.

## 2021-06-26 ENCOUNTER — Encounter: Payer: Self-pay | Admitting: Psychology

## 2021-07-02 DIAGNOSIS — H2513 Age-related nuclear cataract, bilateral: Secondary | ICD-10-CM | POA: Diagnosis not present

## 2021-07-16 ENCOUNTER — Ambulatory Visit
Admission: RE | Admit: 2021-07-16 | Discharge: 2021-07-16 | Disposition: A | Discharge status: Home / Self Care | Payer: Medicare HMO | Source: Ambulatory Visit | Attending: Internal Medicine | Admitting: Internal Medicine

## 2021-07-16 DIAGNOSIS — I6782 Cerebral ischemia: Secondary | ICD-10-CM | POA: Diagnosis not present

## 2021-07-16 DIAGNOSIS — R413 Other amnesia: Secondary | ICD-10-CM | POA: Diagnosis not present

## 2021-07-20 ENCOUNTER — Telehealth: Payer: Self-pay | Admitting: Internal Medicine

## 2021-07-20 NOTE — Telephone Encounter (Signed)
Pt called in and is wanting to know status of medication for plaque build up on artery and brain.   States MD sent a MyChart message, I did not see documentation and was unable to provide an update.   Please call pt at earliest convenience.

## 2021-07-21 NOTE — Telephone Encounter (Signed)
Called pt. Unable to lvm. Pt did view her results on mychart.

## 2021-07-28 ENCOUNTER — Encounter: Payer: Self-pay | Admitting: Internal Medicine

## 2021-07-28 ENCOUNTER — Other Ambulatory Visit: Payer: Self-pay

## 2021-07-28 ENCOUNTER — Ambulatory Visit (INDEPENDENT_AMBULATORY_CARE_PROVIDER_SITE_OTHER): Payer: Medicare HMO | Admitting: Internal Medicine

## 2021-07-28 DIAGNOSIS — I639 Cerebral infarction, unspecified: Secondary | ICD-10-CM | POA: Insufficient documentation

## 2021-07-28 DIAGNOSIS — Z8673 Personal history of transient ischemic attack (TIA), and cerebral infarction without residual deficits: Secondary | ICD-10-CM | POA: Diagnosis not present

## 2021-07-28 DIAGNOSIS — R413 Other amnesia: Secondary | ICD-10-CM | POA: Diagnosis not present

## 2021-07-28 MED ORDER — ROSUVASTATIN CALCIUM 20 MG PO TABS: 20.0000 mg | ORAL_TABLET | Freq: Every day | ORAL | 3 refills | Status: DC

## 2021-07-28 NOTE — Assessment & Plan Note (Signed)
Could be related to vascular changes with several old chronic strokes on recent MRI. She does not have good insight into her condition. They will see neurology as she needs more objective evidence.

## 2021-07-28 NOTE — Progress Notes (Addendum)
° °  Subjective:   Patient ID: Gina King, female    DOB: Oct 07, 1945, 75 y.o.   MRN: [id number]  HPI The patient is a 76 YO female coming in with husband to discuss imaging results.   Review of Systems  Constitutional: Negative.   HENT: Negative.    Eyes: Negative.   Respiratory:  Negative for cough, chest tightness and shortness of breath.   Cardiovascular:  Negative for chest pain, palpitations and leg swelling.  Gastrointestinal:  Negative for abdominal distention, abdominal pain, constipation, diarrhea, nausea and vomiting.  Musculoskeletal: Negative.   Skin: Negative.   Neurological: Negative.        Memory change  Psychiatric/Behavioral: Negative.     Objective:  Physical Exam Constitutional:      Appearance: She is well-developed.  HENT:     Head: Normocephalic and atraumatic.  Cardiovascular:     Rate and Rhythm: Normal rate and regular rhythm.  Pulmonary:     Effort: Pulmonary effort is normal. No respiratory distress.     Breath sounds: Normal breath sounds. No wheezing or rales.  Abdominal:     General: Bowel sounds are normal. There is no distension.     Palpations: Abdomen is soft.     Tenderness: There is no abdominal tenderness. There is no rebound.  Musculoskeletal:     Cervical back: Normal range of motion.  Skin:    General: Skin is warm and dry.  Neurological:     Mental Status: She is alert and oriented to person, place, and time.     Coordination: Coordination normal.    Vitals:   07/28/21 1522  BP: 120/72  Pulse: 87  Temp: 98.3 F (36.8 C)  TempSrc: Oral  SpO2: 96%  Weight: 113 lb (51.3 kg)  Height: 5\' 3"  (1.6 m)    This visit occurred during the SARS-CoV-2 public health emergency.  Safety protocols were in place, including screening questions prior to the visit, additional usage of staff PPE, and extensive cleaning of exam room while observing appropriate contact time as indicated for disinfecting solutions.   Assessment & Plan:  Visit  time 20 minutes in face to face communication with patient and coordination of care, additional 10 minutes spent in record review, coordination or care, ordering tests, communicating/referring to other healthcare professionals, documenting in medical records all on the same day of the visit for total time 30 minutes spent on the visit.

## 2021-07-28 NOTE — Patient Instructions (Signed)
We will start the crestor 1 pill daily to help the cholesterol and the plaque build up in the brain.

## 2021-07-28 NOTE — Assessment & Plan Note (Signed)
New finding on MRI brain of prior stroke. She is taking aspirin 81 mg daily which we will continue. We have prescribed crestor 20 mg daily. BP is at goal and no known diabetes.

## 2021-07-31 ENCOUNTER — Other Ambulatory Visit: Payer: Self-pay | Admitting: Internal Medicine

## 2021-07-31 DIAGNOSIS — Z1231 Encounter for screening mammogram for malignant neoplasm of breast: Secondary | ICD-10-CM

## 2021-08-08 ENCOUNTER — Ambulatory Visit
Admission: RE | Admit: 2021-08-08 | Discharge: 2021-08-08 | Disposition: A | Discharge status: Home / Self Care | Payer: Medicare HMO | Source: Ambulatory Visit | Attending: Internal Medicine | Admitting: Internal Medicine

## 2021-08-08 DIAGNOSIS — Z1231 Encounter for screening mammogram for malignant neoplasm of breast: Secondary | ICD-10-CM

## 2021-08-25 ENCOUNTER — Other Ambulatory Visit: Payer: Self-pay

## 2021-08-25 ENCOUNTER — Ambulatory Visit: Payer: Medicare HMO

## 2021-08-25 ENCOUNTER — Encounter: Payer: Self-pay | Admitting: Psychology

## 2021-08-25 ENCOUNTER — Ambulatory Visit (INDEPENDENT_AMBULATORY_CARE_PROVIDER_SITE_OTHER): Payer: Medicare HMO | Admitting: Psychology

## 2021-08-25 DIAGNOSIS — F03B0 Unspecified dementia, moderate, without behavioral disturbance, psychotic disturbance, mood disturbance, and anxiety: Secondary | ICD-10-CM | POA: Diagnosis not present

## 2021-08-25 DIAGNOSIS — R4189 Other symptoms and signs involving cognitive functions and awareness: Secondary | ICD-10-CM

## 2021-08-25 DIAGNOSIS — F0390 Unspecified dementia without behavioral disturbance: Secondary | ICD-10-CM

## 2021-08-25 HISTORY — DX: Unspecified dementia, unspecified severity, without behavioral disturbance, psychotic disturbance, mood disturbance, and anxiety: F03.90
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
## 2021-08-25 NOTE — Progress Notes (Signed)
? ?  Psychometrician Note ?  ?Cognitive testing was administered to Gina King by Cruzita Lederer, B.S. (psychometrist) under the supervision of Dr. Christia Reading, Ph.D., licensed psychologist on 08/25/2021. Gina King did not appear overtly distressed by the testing session per behavioral observation or responses across self-report questionnaires. Rest breaks were offered.  ?  ?The battery of tests administered was selected by Dr. Christia Reading, Ph.D. with consideration to Gina King's current level of functioning, the nature of her symptoms, emotional and behavioral responses during interview, level of literacy, observed level of motivation/effort, and the nature of the referral question. This battery was communicated to the psychometrist. Communication between Dr. Christia Reading, Ph.D. and the psychometrist was ongoing throughout the evaluation and Dr. Christia Reading, Ph.D. was immediately accessible at all times. Dr. Christia Reading, Ph.D. provided supervision to the psychometrist on the date of this service to the extent necessary to assure the quality of all services provided.  ?  ?Gina King will return within approximately 1-2 weeks for an interactive feedback session with Dr. Melvyn Novas at which time her test performances, clinical impressions, and treatment recommendations will be reviewed in detail. Gina King understands she can contact our office should she require our assistance before this time. ? ?A total of 130 minutes of billable time were spent face-to-face with Ms. Mccartney by the psychometrist. This includes both test administration and scoring time. Billing for these services is reflected in the clinical report generated by Dr. Christia Reading, Ph.D. ? ?This note reflects time spent with the psychometrician and does not include test scores or any clinical interpretations made by Dr. Melvyn Novas. The full report will follow in a separate note.  ?

## 2021-09-04 ENCOUNTER — Ambulatory Visit (INDEPENDENT_AMBULATORY_CARE_PROVIDER_SITE_OTHER): Payer: Medicare HMO | Admitting: Psychology

## 2021-09-04 DIAGNOSIS — F03B0 Unspecified dementia, moderate, without behavioral disturbance, psychotic disturbance, mood disturbance, and anxiety: Secondary | ICD-10-CM | POA: Diagnosis not present

## 2021-09-04 DIAGNOSIS — I639 Cerebral infarction, unspecified: Secondary | ICD-10-CM | POA: Diagnosis not present

## 2021-09-04 NOTE — Progress Notes (Signed)
? ?  Neuropsychology Feedback Session ?[place]. [facility] ?[place] Department of Neurology ? ?Reason for Referral:  ? ?Gina King is a 76 y.o. right-handed Caucasian female referred by  Pricilla Holm, M.D. , to characterize her current cognitive functioning and assist with diagnostic clarity and treatment planning in the context of subjective cognitive decline and recent imaging revealing a few chronic small vessel infarcts.  ? ?Feedback:  ? ?Gina King completed a comprehensive neuropsychological evaluation on 08/25/2021. Please refer to that encounter for the full report and recommendations. Briefly, results suggested quite severe impairment across essentially all assessed cognitive domains. Some performance variability was exhibited across processing speed, attention/concentration, and visuospatial abilities. However, performances were generally below expectation. Regarding the most common neurodegenerative illnesses, her behavioral pattern does not strongly suggest Lewy body dementia, frontotemporal dementia, or another more rare parkinsonian condition. Recent neuroimaging revealed moderate cerebrovascular changes and several chronic small vessel infarcts. However, impairment across testing does not fit typical vascular dementia presentations and suggests far more widespread and severe impairment than what would typically be expected. I cannot rule out Alzheimer's disease given her amnestic memory with evidence for rapid forgetting and a profound memory storage deficit. These would reflect classic memory patterns in this illness and as Alzheimer's disease progresses, all areas of cognitive functioning are affected in later stages. Alzheimer's disease and vascular changes commonly co-occur, causing a mixed dementia presentation, which is also plausible. However, the lack of a discernible pattern across testing and no sense of true progression of impairment over time makes an underlying etiology  difficult to determine.  ? ?Gina King was accompanied by her husband during the current feedback session. Content of the current session focused on the results of her neuropsychological evaluation. Gina King was given the opportunity to ask questions; however, she expressed her opinion that the testing was inaccurate and not reflective of her abilities. Her husband's questions were answered. They were encouraged to reach out should additional questions arise. A copy of her report was provided at the conclusion of the visit.  ? ?  ? ?20 minutes were spent conducting the current feedback session with Gina King, billed as one unit [phone].  ?

## 2021-09-07 ENCOUNTER — Ambulatory Visit (INDEPENDENT_AMBULATORY_CARE_PROVIDER_SITE_OTHER): Payer: Medicare HMO

## 2021-09-07 DIAGNOSIS — Z0000 Encounter for general adult medical examination without abnormal findings: Secondary | ICD-10-CM | POA: Diagnosis not present

## 2021-09-07 NOTE — Patient Instructions (Signed)
Gina King , ?Thank you for taking time to come for your Medicare Wellness Visit. I appreciate your ongoing commitment to your health goals. Please review the following plan we discussed and let me know if I can assist you in the future.  ? ?Screening recommendations/referrals: ?Colonoscopy: 03/04/2014; due every 10 years ?Mammogram: 08/08/2021; due every year ?Bone Density: 09/05/2020; due every 2-3 years ?Recommended yearly ophthalmology/optometry visit for glaucoma screening and checkup ?Recommended yearly dental visit for hygiene and checkup ? ?Vaccinations: ?Influenza vaccine: 03/2021 ?Pneumococcal vaccine: 02/22/2014, 11/07/2015 ?Tdap vaccine: due ?Shingles vaccine: 09/09/2020, 11/23/2020   ?Covid-19: 07/31/2019, 08/14/2019, 03/15/2020, 09/02/2020, 02/15/2021 ? ?Advanced directives: Please bring a copy of your health care power of attorney and living will to the office at your convenience. ? ?Conditions/risks identified: Yes ? ?Next appointment: Please schedule your next Medicare Wellness Visit with your Nurse Health Advisor in 1 year by calling [phone]. ? ? ?Preventive Care 18 Years and Older, Female ?Preventive care refers to lifestyle choices and visits with your health care provider that can promote health and wellness. ?What does preventive care include? ?A yearly physical exam. This is also called an annual well check. ?Dental exams once or twice a year. ?Routine eye exams. Ask your health care provider how often you should have your eyes checked. ?Personal lifestyle choices, including: ?Daily care of your teeth and gums. ?Regular physical activity. ?Eating a healthy diet. ?Avoiding tobacco and drug use. ?Limiting alcohol use. ?Practicing safe sex. ?Taking low-dose aspirin every day. ?Taking vitamin and mineral supplements as recommended by your health care provider. ?What happens during an annual well check? ?The services and screenings done by your health care provider during your annual well check will depend on  your age, overall health, lifestyle risk factors, and family history of disease. ?Counseling  ?Your health care provider may ask you questions about your: ?Alcohol use. ?Tobacco use. ?Drug use. ?Emotional well-being. ?Home and relationship well-being. ?Sexual activity. ?Eating habits. ?History of falls. ?Memory and ability to understand (cognition). ?Work and work Statistician. ?Reproductive health. ?Screening  ?You may have the following tests or measurements: ?Height, weight, and BMI. ?Blood pressure. ?Lipid and cholesterol levels. These may be checked every 5 years, or more frequently if you are over 55 years old. ?Skin check. ?Lung cancer screening. You may have this screening every year starting at age 56 if you have a 30-pack-year history of smoking and currently smoke or have quit within the past 15 years. ?Fecal occult blood test (FOBT) of the stool. You may have this test every year starting at age 64. ?Flexible sigmoidoscopy or colonoscopy. You may have a sigmoidoscopy every 5 years or a colonoscopy every 10 years starting at age 61. ?Hepatitis C blood test. ?Hepatitis B blood test. ?Sexually transmitted disease (STD) testing. ?Diabetes screening. This is done by checking your blood sugar (glucose) after you have not eaten for a while (fasting). You may have this done every 1-3 years. ?Bone density scan. This is done to screen for osteoporosis. You may have this done starting at age 38. ?Mammogram. This may be done every 1-2 years. Talk to your health care provider about how often you should have regular mammograms. ?Talk with your health care provider about your test results, treatment options, and if necessary, the need for more tests. ?Vaccines  ?Your health care provider may recommend certain vaccines, such as: ?Influenza vaccine. This is recommended every year. ?Tetanus, diphtheria, and acellular pertussis (Tdap, Td) vaccine. You may need a Td booster every 10 years. ?  Zoster vaccine. You may need this  after age 59. ?Pneumococcal 13-valent conjugate (PCV13) vaccine. One dose is recommended after age 71. ?Pneumococcal polysaccharide (PPSV23) vaccine. One dose is recommended after age 43. ?Talk to your health care provider about which screenings and vaccines you need and how often you need them. ?This information is not intended to replace advice given to you by your health care provider. Make sure you discuss any questions you have with your health care provider. ?Document Released: 06/17/2015 Document Revised: 02/08/2016 Document Reviewed: 03/22/2015 ?Elsevier Interactive Patient Education ? 2017 [place]. ? ?Fall Prevention in the Home ?Falls can cause injuries. They can happen to people of all ages. There are many things you can do to make your home safe and to help prevent falls. ?What can I do on the outside of my home? ?Regularly fix the edges of walkways and driveways and fix any cracks. ?Remove anything that might make you trip as you walk through a door, such as a raised step or threshold. ?Trim any bushes or trees on the path to your home. ?Use bright outdoor lighting. ?Clear any walking paths of anything that might make someone trip, such as rocks or tools. ?Regularly check to see if handrails are loose or broken. Make sure that both sides of any steps have handrails. ?Any raised decks and porches should have guardrails on the edges. ?Have any leaves, snow, or ice cleared regularly. ?Use sand or salt on walking paths during winter. ?Clean up any spills in your garage right away. This includes oil or grease spills. ?What can I do in the bathroom? ?Use night lights. ?Install grab bars by the toilet and in the tub and shower. Do not use towel bars as grab bars. ?Use non-skid mats or decals in the tub or shower. ?If you need to sit down in the shower, use a plastic, non-slip stool. ?Keep the floor dry. Clean up any water that spills on the floor as soon as it happens. ?Remove soap buildup in the tub or  shower regularly. ?Attach bath mats securely with double-sided non-slip rug tape. ?Do not have throw rugs and other things on the floor that can make you trip. ?What can I do in the bedroom? ?Use night lights. ?Make sure that you have a light by your bed that is easy to reach. ?Do not use any sheets or blankets that are too big for your bed. They should not hang down onto the floor. ?Have a firm chair that has side arms. You can use this for support while you get dressed. ?Do not have throw rugs and other things on the floor that can make you trip. ?What can I do in the kitchen? ?Clean up any spills right away. ?Avoid walking on wet floors. ?Keep items that you use a lot in easy-to-reach places. ?If you need to reach something above you, use a strong step stool that has a grab bar. ?Keep electrical cords out of the way. ?Do not use floor polish or wax that makes floors slippery. If you must use wax, use non-skid floor wax. ?Do not have throw rugs and other things on the floor that can make you trip. ?What can I do with my stairs? ?Do not leave any items on the stairs. ?Make sure that there are handrails on both sides of the stairs and use them. Fix handrails that are broken or loose. Make sure that handrails are as long as the stairways. ?Check any carpeting to make sure that  it is firmly attached to the stairs. Fix any carpet that is loose or worn. ?Avoid having throw rugs at the top or bottom of the stairs. If you do have throw rugs, attach them to the floor with carpet tape. ?Make sure that you have a light switch at the top of the stairs and the bottom of the stairs. If you do not have them, ask someone to add them for you. ?What else can I do to help prevent falls? ?Wear shoes that: ?Do not have high heels. ?Have rubber bottoms. ?Are comfortable and fit you well. ?Are closed at the toe. Do not wear sandals. ?If you use a stepladder: ?Make sure that it is fully opened. Do not climb a closed stepladder. ?Make  sure that both sides of the stepladder are locked into place. ?Ask someone to hold it for you, if possible. ?Clearly mark and make sure that you can see: ?Any grab bars or handrails. ?First and last steps.

## 2021-09-07 NOTE — Progress Notes (Signed)
?I connected with [name] today by telephone and verified that I am speaking with the correct person using two identifiers. ?Location patient: home ?Location provider: work ?Persons participating in the virtual visit: patient, provider. ?  ?I discussed the limitations, risks, security and privacy concerns of performing an evaluation and management service by telephone and the availability of in person appointments. I also discussed with the patient that there may be a patient responsible charge related to this service. The patient expressed understanding and verbally consented to this telephonic visit.  ?  ?Interactive audio and video telecommunications were attempted between this provider and patient, however failed, due to patient having technical difficulties OR patient did not have access to video capability.  We continued and completed visit with audio only. ? ?Some vital signs may be absent or patient reported.  ? ?Time Spent with patient on telephone encounter: 30 minutes ? ?Subjective:  ? Gina King is a 76 y.o. female who presents for Medicare Annual (Subsequent) preventive examination. ? ?Review of Systems    ? ?Cardiac Risk Factors include: advanced age (>41mn, >>24women);dyslipidemia ? ?   ?Objective:  ?  ?There were no vitals filed for this visit. ?There is no height or weight on file to calculate BMI. ? ? ?  09/07/2021  ? 11:20 AM 10/31/2018  ? 10:14 AM 10/29/2017  ? 11:30 AM 03/04/2014  ?  8:47 AM 02/25/2014  ? 12:47 PM  ?Advanced Directives  ?Does Patient Have a Medical Advance Directive? No No No No No  ?Does patient want to make changes to medical advance directive? No - Patient declined No - Patient declined     ?Would patient like information on creating a medical advance directive? No - Patient declined  Yes (ED - Information included in AVS) No - patient declined information No - patient declined information  ? ? ?Current Medications (verified) ?Outpatient Encounter Medications as of 09/07/2021   ?Medication Sig  ? cholecalciferol (VITAMIN D) 1000 UNITS tablet Take 1,000 Units by mouth daily.  ? diphenhydrAMINE (SOMINEX) 25 MG tablet Take 25 mg by mouth at bedtime as needed for sleep.  ? FLUAD QUADRIVALENT 0.5 ML injection   ? Multiple Vitamin (MULTIVITAMIN) tablet Take 1 tablet by mouth daily.  ? pantoprazole (PROTONIX) 20 MG tablet TAKE 1 TABLET EVERY DAY  ? PFIZER COVID-19 VAC BIVALENT injection   ? rosuvastatin (CRESTOR) 20 MG tablet Take 1 tablet (20 mg total) by mouth daily.  ? triamcinolone cream (KENALOG) 0.1 % Apply 1 application topically 2 (two) times daily.  ? vitamin B-12 (CYANOCOBALAMIN) 1000 MCG tablet Take 1,000 mcg by mouth daily. Reported on 11/07/2015  ? vitamin C (ASCORBIC ACID) 500 MG tablet Take 500 mg by mouth 2 (two) times daily.  ? ?No facility-administered encounter medications on file as of 09/07/2021.  ? ? ?Allergies (verified) ?Gabapentin  ? ?History: ?Past Medical History:  ?Diagnosis Date  ? CVA (cerebral vascular accident)   ? 07/2021 MRI - Moderate chronic microvascular ischemic changes with a few superimposed chronic small vessel infarcts  ? GERD (gastroesophageal reflux disease) 12/30/2006  ? Mixed hyperlipidemia 07/28/2007  ? Moderate dementia, unclear etiology 08/25/2021  ? Numbness and tingling of left leg 11/30/2020  ? Osteopenia 12/30/2006  ? Restless leg syndrome 06/23/2021  ? Vitamin D deficiency 07/28/2007  ? ?Past Surgical History:  ?Procedure Laterality Date  ? ABDOMINAL HYSTERECTOMY  1984  ? ?Family History  ?Problem Relation Age of Onset  ? Memory loss Father   ?  with advanced age and other medical complications  ? Memory loss Brother   ?     concerns for dementia presentation  ? Kidney failure Brother   ? Colon cancer Neg Hx   ? ?Social History  ? ?Socioeconomic History  ? Marital status: Married  ?  Spouse name: Not on file  ? Number of children: 2  ? Years of education: 10  ? Highest education level: 10th grade  ?Occupational History  ? Occupation: retired  ?   Employer: [employer]  ?Tobacco Use  ? Smoking status: Never  ? Smokeless tobacco: Never  ?Vaping Use  ? Vaping Use: Never used  ?Substance and Sexual Activity  ? Alcohol use: No  ? Drug use: No  ? Sexual activity: Not Currently  ?Other Topics Concern  ? Not on file  ?Social History Narrative  ? Not on file  ? ?Social Determinants of Health  ? ?Financial Resource Strain: Low Risk   ? Difficulty of Paying Living Expenses: Not hard at all  ?Food Insecurity: No Food Insecurity  ? Worried About Charity fundraiser in the Last Year: Never true  ? Ran Out of Food in the Last Year: Never true  ?Transportation Needs: No Transportation Needs  ? Lack of Transportation (Medical): No  ? Lack of Transportation (Non-Medical): No  ?Physical Activity: Sufficiently Active  ? Days of Exercise per Week: 5 days  ? Minutes of Exercise per Session: 30 min  ?Stress: No Stress Concern Present  ? Feeling of Stress : Not at all  ?Social Connections: Socially Integrated  ? Frequency of Communication with Friends and Family: More than three times a week  ? Frequency of Social Gatherings with Friends and Family: More than three times a week  ? Attends Religious Services: More than 4 times per year  ? Active Member of Clubs or Organizations: No  ? Attends Archivist Meetings: More than 4 times per year  ? Marital Status: Married  ? ? ?Tobacco Counseling ?Counseling given: Not Answered ? ? ?Clinical Intake: ? ?Pre-visit preparation completed: Yes ? ?Pain : No/denies pain ? ?  ? ?Nutritional Risks: None ?Diabetes: No ? ?How often do you need to have someone help you when you read instructions, pamphlets, or other written materials from your doctor or pharmacy?: 1 - Never ?What is the last grade level you completed in school?: 10th grade ? ?Diabetic? no ? ?Interpreter Needed?: No ? ?Information entered by :: Lisette Abu, LPN ? ? ?Activities of Daily Living ? ?  09/07/2021  ? 11:34 AM  ?In your present state of health, do you have  any difficulty performing the following activities:  ?Hearing? 0  ?Vision? 0  ?Difficulty concentrating or making decisions? 0  ?Walking or climbing stairs? 0  ?Dressing or bathing? 0  ?Doing errands, shopping? 0  ?Preparing Food and eating ? N  ?Using the Toilet? N  ?In the past six months, have you accidently leaked urine? N  ?Do you have problems with loss of bowel control? N  ?Managing your Medications? N  ?Managing your Finances? N  ?Housekeeping or managing your Housekeeping? N  ? ? ?Patient Care Team: ?Hoyt Koch, MD as PCP - General (Internal Medicine) ?Adline Potter as Consulting Physician (Optometry) ?A Rosie Place as Consulting Physician (Optometry) ? ?Indicate any recent Medical Services you may have received from other than [facility] providers in the past year (date may be approximate). ? ?   ?Assessment:  ? This  is a routine wellness examination for Gina King. ? ?Hearing/Vision screen ?Hearing Screening - Comments:: Patient denied any hearing difficulty.   ?No hearing aids. ? ?Vision Screening - Comments:: Patient does not wear any corrective lenses/contacts.   ?Eye exam done by:  Dr. Adline Potter at [facility] ? ? ?Dietary issues and exercise activities discussed: ?Current Exercise Habits: Home exercise routine, Type of exercise: walking, Time (Minutes): 30, Frequency (Times/Week): 5, Weekly Exercise (Minutes/Week): 150, Intensity: Moderate, Exercise limited by: None identified ? ? Goals Addressed   ? ?  ?  ?  ?  ? This Visit's Progress  ?  Patient Stated     ?  Stay healthy. ?  ? ?  ?Depression Screen ? ?  09/07/2021  ? 11:22 AM 06/23/2021  ?  3:00 PM 08/29/2020  ?  1:16 PM 10/31/2018  ? 10:14 AM 10/29/2017  ? 11:30 AM 11/07/2015  ?  1:10 PM  ?PHQ 2/9 Scores  ?PHQ - 2 Score 0 0 0 0 1 0  ?PHQ- 9 Score     1   ?  ?Fall Risk ? ?  09/07/2021  ? 11:21 AM 06/23/2021  ?  3:00 PM 08/29/2020  ?  1:16 PM 10/31/2018  ? 10:14 AM 10/29/2017  ? 11:30 AM  ?Fall Risk   ?Falls in the past year? 0 0 0 0 No   ?Number falls in past yr: 0 0 0 0   ?Injury with Fall? 0 0 0    ?Risk for fall due to : No Fall Risks      ?Follow up Falls evaluation completed      ? ? ?FALL RISK PREVENTION PERTAINING TO THE HOME: ?

## 2021-09-12 ENCOUNTER — Encounter: Payer: Self-pay | Admitting: Physician Assistant

## 2021-09-12 ENCOUNTER — Other Ambulatory Visit (INDEPENDENT_AMBULATORY_CARE_PROVIDER_SITE_OTHER): Payer: Medicare HMO

## 2021-09-12 ENCOUNTER — Ambulatory Visit: Payer: Medicare HMO | Admitting: Physician Assistant

## 2021-09-12 ENCOUNTER — Other Ambulatory Visit: Payer: Self-pay

## 2021-09-12 VITALS — BP 155/70 | HR 74 | Resp 18 | Ht 64.0 in | Wt 116.0 lb

## 2021-09-12 DIAGNOSIS — F03B0 Unspecified dementia, moderate, without behavioral disturbance, psychotic disturbance, mood disturbance, and anxiety: Secondary | ICD-10-CM

## 2021-09-12 DIAGNOSIS — Z8673 Personal history of transient ischemic attack (TIA), and cerebral infarction without residual deficits: Secondary | ICD-10-CM | POA: Diagnosis not present

## 2021-09-12 DIAGNOSIS — I10 Essential (primary) hypertension: Secondary | ICD-10-CM | POA: Diagnosis not present

## 2021-09-12 DIAGNOSIS — Z113 Encounter for screening for infections with a predominantly sexual mode of transmission: Secondary | ICD-10-CM | POA: Diagnosis not present

## 2021-09-12 DIAGNOSIS — F0390 Unspecified dementia without behavioral disturbance: Secondary | ICD-10-CM

## 2021-09-12 LAB — COMPREHENSIVE METABOLIC PANEL
ALT: 12 U/L (ref 0–35)
AST: 17 U/L (ref 0–37)
Albumin: 4.3 g/dL (ref 3.5–5.2)
Alkaline Phosphatase: 53 U/L (ref 39–117)
BUN: 13 mg/dL (ref 6–23)
CO2: 31 mEq/L (ref 19–32)
Calcium: 9.9 mg/dL (ref 8.4–10.5)
Chloride: 103 mEq/L (ref 96–112)
Creatinine, Ser: 0.8 mg/dL (ref 0.40–1.20)
GFR: 73.07 mL/min (ref >60.00)
Glucose, Bld: 95 mg/dL (ref 70–99)
Potassium: 4.2 mEq/L (ref 3.5–5.1)
Sodium: 141 mEq/L (ref 135–145)
Total Bilirubin: 0.5 mg/dL (ref 0.2–1.2)
Total Protein: 6.7 g/dL (ref 6.0–8.3)

## 2021-09-12 LAB — CBC
HCT: 43.6 % (ref 36.0–46.0)
Hemoglobin: 14.6 g/dL (ref 12.0–15.0)
MCHC: 33.4 g/dL (ref 30.0–36.0)
MCV: 93.9 fl (ref 78.0–100.0)
Platelets: 235 10*3/uL (ref 150.0–400.0)
RBC: 4.65 Mil/uL (ref 3.87–5.11)
RDW: 12.9 % (ref 11.5–15.5)
WBC: 5 10*3/uL (ref 4.0–10.5)

## 2021-09-12 LAB — C-REACTIVE PROTEIN: CRP: <1 mg/dL (ref 0.5–20.0)

## 2021-09-12 LAB — SEDIMENTATION RATE: Sed Rate: 16 mm/hr (ref 0–30)

## 2021-09-12 MED ORDER — DONEPEZIL HCL 10 MG PO TABS: ORAL_TABLET | ORAL | 11 refills | Status: DC

## 2021-09-12 NOTE — Patient Instructions (Addendum)
It was a pleasure to see you today at our office.  ? ?Recommendations: ? ?Meds: ?Follow up in 6 weeks ?We will start donepezil half tablet ('5mg'$ ) daily for 2  weeks.  If you are tolerating the medication, then after 2 weeks, we will increase the dose to a full tablet of 10 mg daily.  Side effects include nausea, vomiting, diarrhea, vivid dreams, and muscle cramps.  Please call the clinic if you experience any of these symptoms.  ?Labs today  ?Lumbar puncture for further diagnosis  ? ? ?RECOMMENDATIONS FOR ALL PATIENTS WITH MEMORY PROBLEMS: ?1. Continue to exercise (Recommend 30 minutes of walking everyday, or 3 hours every week) ?2. Increase social interactions - continue going to [place] and enjoy social gatherings with friends and family ?3. Eat healthy, avoid fried foods and eat more fruits and vegetables ?4. Maintain adequate blood pressure, blood sugar, and blood cholesterol level. Reducing the risk of stroke and cardiovascular disease also helps promoting better memory. ?5. Avoid stressful situations. Live a simple life and avoid aggravations. Organize your time and prepare for the next day in anticipation. ?6. Sleep well, avoid any interruptions of sleep and avoid any distractions in the bedroom that may interfere with adequate sleep quality ?7. Avoid sugar, avoid sweets as there is a strong link between excessive sugar intake, diabetes, and cognitive impairment ?We discussed the Mediterranean diet, which has been shown to help patients reduce the risk of progressive memory disorders and reduces cardiovascular risk. This includes eating fish, eat fruits and green leafy vegetables, nuts like almonds and hazelnuts, walnuts, and also use olive oil. Avoid fast foods and fried foods as much as possible. Avoid sweets and sugar as sugar use has been linked to worsening of memory function. ? ?There is always a concern of gradual progression of memory problems. If this is the case, then we may need to adjust level of  care according to patient needs. Support, both to the patient and caregiver, should then be put into place.  ?  ? ? ? ?FALL PRECAUTIONS: Be cautious when walking. Scan the area for obstacles that may increase the risk of trips and falls. When getting up in the mornings, sit up at the edge of the bed for a few minutes before getting out of bed. Consider elevating the bed at the head end to avoid drop of blood pressure when getting up. Walk always in a well-lit room (use night lights in the walls). Avoid area rugs or power cords from appliances in the middle of the walkways. Use a walker or a cane if necessary and consider physical therapy for balance exercise. Get your eyesight checked regularly. ? ?FINANCIAL OVERSIGHT: Supervision, especially oversight when making financial decisions or transactions is also recommended. ? ?HOME SAFETY: Consider the safety of the kitchen when operating appliances like stoves, microwave oven, and blender. Consider having supervision and share cooking responsibilities until no longer able to participate in those. Accidents with firearms and other hazards in the house should be identified and addressed as well. ? ? ?ABILITY TO BE LEFT ALONE: If patient is unable to contact 911 operator, consider using LifeLine, or when the need is there, arrange for someone to stay with patients. Smoking is a fire hazard, consider supervision or cessation. Risk of wandering should be assessed by caregiver and if detected at any point, supervision and safe proof recommendations should be instituted. ? ?MEDICATION SUPERVISION: Inability to self-administer medication needs to be constantly addressed. Implement a mechanism to ensure safe  administration of the medications. ? ? ?DRIVING: Regarding driving, in patients with progressive memory problems, driving will be impaired. We advise to have someone else do the driving if trouble finding directions or if minor accidents are reported. Independent driving  assessment is available to determine safety of driving. ? ? ?If you are interested in the driving assessment, you can contact the following: ? ?[facility] [employer] [facility] in [place] ? ?[place] [phone] ? ?[facility] [phone] ? ?[facility] [phone] or [phone] ? ?  ? ? ?Mediterranean Diet ?A Mediterranean diet refers to food and lifestyle choices that are based on the traditions of countries located on the [employer]. This way of eating has been shown to help prevent certain conditions and improve outcomes for people who have chronic diseases, like kidney disease and heart disease. ?What are tips for following this plan? ?Lifestyle  ?Cook and eat meals together with your family, when possible. ?Drink enough fluid to keep your urine clear or pale yellow. ?Be physically active every day. This includes: ?Aerobic exercise like running or swimming. ?Leisure activities like gardening, walking, or housework. ?Get 7-8 hours of sleep each night. ?If recommended by your health care provider, drink red wine in moderation. This means 1 glass a day for nonpregnant women and 2 glasses a day for men. A glass of wine equals 5 oz (150 mL). ?Reading food labels  ?Check the serving size of packaged foods. For foods such as rice and pasta, the serving size refers to the amount of cooked product, not dry. ?Check the total fat in packaged foods. Avoid foods that have saturated fat or trans fats. ?Check the ingredients list for added sugars, such as corn syrup. ?Shopping  ?At the grocery store, buy most of your food from the areas near the walls of the store. This includes: ?Fresh fruits and vegetables (produce). ?Grains, beans, nuts, and seeds. Some of these may be available in unpackaged forms or large amounts (in bulk). ?Fresh seafood. ?Poultry and eggs. ?Low-fat dairy products. ?Buy whole ingredients instead of prepackaged foods. ?Buy fresh fruits and  vegetables in-season from local farmers markets. ?Buy frozen fruits and vegetables in resealable bags. ?If you do not have access to quality fresh seafood, buy precooked frozen shrimp or canned fish, such as tuna, salmon, or sardines. ?Buy small amounts of raw or cooked vegetables, salads, or olives from the deli or salad bar at your store. ?Stock your pantry so you always have certain foods on hand, such as olive oil, canned tuna, canned tomatoes, rice, pasta, and beans. ?Cooking  ?Cook foods with extra-virgin olive oil instead of using butter or other vegetable oils. ?Have meat as a side dish, and have vegetables or grains as your main dish. This means having meat in small portions or adding small amounts of meat to foods like pasta or stew. ?Use beans or vegetables instead of meat in common dishes like chili or lasagna. ?Experiment with different cooking methods. Try roasting or broiling vegetables instead of steaming or saut?eing them. ?Add frozen vegetables to soups, stews, pasta, or rice. ?Add nuts or seeds for added healthy fat at each meal. You can add these to yogurt, salads, or vegetable dishes. ?Marinate fish or vegetables using olive oil, lemon juice, garlic, and fresh herbs. ?Meal planning  ?Plan to eat 1 vegetarian meal one day each week. Try to work up to 2 vegetarian meals, if possible. ?Eat seafood 2 or more times a week. ?Have healthy snacks readily available,  such as: ?Vegetable sticks with hummus. ?[place] yogurt. ?Fruit and nut trail mix. ?Eat balanced meals throughout the week. This includes: ?Fruit: 2-3 servings a day ?Vegetables: 4-5 servings a day ?Low-fat dairy: 2 servings a day ?Fish, poultry, or lean meat: 1 serving a day ?Beans and legumes: 2 or more servings a week ?Nuts and seeds: 1-2 servings a day ?Whole grains: 6-8 servings a day ?Extra-virgin olive oil: 3-4 servings a day ?Limit red meat and sweets to only a few servings a month ?What are my food choices? ?Mediterranean  diet ?Recommended ?Grains: Whole-grain pasta. Brown rice. Bulgar wheat. Polenta. Couscous. Whole-wheat bread. [place]. ?Vegetables: Artichokes. Beets. Broccoli. Cabbage. Carrots. Eggplant. Green beans. Chard. Kale. Sp

## 2021-09-12 NOTE — Progress Notes (Signed)
? ?Assessment/Plan:  ? ? ?Moderate dementia without behavioral disturbance, unspecified dementia type ? ?This is a pleasant 76 year old woman, with rapidly developing symptoms of memory loss undergoing an neurocognitive testing yielding moderate dementia without behavioral disturbance. ? ? ? Recommendations:  ? ?Discussed safety both in and out of the home.  ?Discussed the importance of regular daily schedule with inclusion of crossword puzzles to maintain brain function.  ?Continue to monitor mood  ?Stay active at least 30 minutes at least 3 times a week.  ?Check rapidly progressive dementia blood tests ?Naps should be scheduled and should be no longer than 60 minutes and should not occur after 2 PM.  ?Mediterranean diet is recommended  ?Control cardiovascular risk factors  ?Lumbar puncture for diagnosis in view of  RDD ?Follow up in 2  months. ? ? ?Case discussed with Dr. Tomi Likens who agrees with the plan ? ? ? ? ?Subjective:  ? ? ?Gina King is a very pleasant 76 y.o. RH female  seen today in follow up for memory loss. This patient is accompanied in the office by her husband who supplements the history.  Previous records as well as any outside records available were reviewed prior to todays visit.  Patient underwent neurocognitive testing yielding a diagnosis of moderate dementia without behavioral disturbance.  She has been experiencing memory issues for the last 4 or 5 months.  Since that time, she the patient repeats herself, has begun to be more argumentative "she hates to be wrong ".  She admits to mood changes, but denies hallucinations or paranoia.  Her husband and her sister-in-law are the main caretakers.  There are no hygiene concerns.  Her medications are in a pillbox, her husband monitors them.  He is also in charge of the finances at this time.  Her appetite is good, denies trouble swallowing.  She does not cook.  She enjoys doing work finding, crossword puzzles, and until 5 years ago, she was a  Archivist trouble swallowing.  She does not cook.  She ambulates without difficulty, denies any falls or head injuries.  She continues to drive without getting lost.  She denies any headache, double vision, dizziness, focal numbness or tingling, unilateral weakness, tremors or anosmia.  She has restless leg syndrome.  No history of seizures, denies urine incontinence, retention, constipation or diarrhea. ? ?PREVIOUS MEDICATIONS:  ? ?CURRENT MEDICATIONS:  ?Outpatient Encounter Medications as of 09/12/2021  ?Medication Sig  ? cholecalciferol (VITAMIN D) 1000 UNITS tablet Take 1,000 Units by mouth daily.  ? diphenhydrAMINE (SOMINEX) 25 MG tablet Take 25 mg by mouth at bedtime as needed for sleep.  ? ferrous sulfate 324 MG TBEC Take 324 mg by mouth.  ? Multiple Vitamin (MULTIVITAMIN) tablet Take 1 tablet by mouth daily.  ? pantoprazole (PROTONIX) 20 MG tablet TAKE 1 TABLET EVERY DAY  ? rosuvastatin (CRESTOR) 20 MG tablet Take 1 tablet (20 mg total) by mouth daily.  ? triamcinolone cream (KENALOG) 0.1 % Apply 1 application topically 2 (two) times daily.  ? vitamin B-12 (CYANOCOBALAMIN) 1000 MCG tablet Take 1,000 mcg by mouth daily. Reported on 11/07/2015  ? vitamin C (ASCORBIC ACID) 500 MG tablet Take 500 mg by mouth 2 (two) times daily.  ? FLUAD QUADRIVALENT 0.5 ML injection  (Patient not taking: Reported on 09/12/2021)  ? PFIZER COVID-19 VAC BIVALENT injection  (Patient not taking: Reported on 09/12/2021)  ? ?No facility-administered encounter medications on file as of 09/12/2021.  ? ? ? ?Objective:  ?  ? ?  PHYSICAL EXAMINATION:   ? ?VITALS:   ?Vitals:  ? 09/12/21 0759  ?BP: (!) 155/70  ?Pulse: 74  ?Resp: 18  ?SpO2: 98%  ?Weight: 116 lb (52.6 kg)  ?Height: '5\' 4"'$  (1.626 m)  ? ? ?GEN:  The patient appears stated age and is in NAD. ?HEENT:  Normocephalic, atraumatic.  ? ?Neurological examination: ? ?General: NAD, well-groomed, appears stated age. ?Orientation: The patient is alert. Oriented to person, place, not to date.   ? Cranial nerves: There is good facial symmetry.The speech is fluent and clear, however it is incoherent at times. No aphasia or dysarthria. Fund of knowledge is reduced. Recent and remote memory are impaired. Attention and concentration are reduced.  Able to name objects and repeat phrases.  Hearing is intact to conversational tone.    ?Sensation: Sensation is intact to light touch throughout ?Motor: Strength is at least antigravity x4. ?Tremors: none  ?DTR's 2/4 in UE/LE  ? ?   ? View : No data to display.  ?  ?  ?  ?  ?   ? View : No data to display.  ?  ?  ?  ?  ? ?  ?Movement examination: ?Tone: There is normal tone in the UE/LE ?Abnormal movements:  no tremor.  No myoclonus.  No asterixis.   ?Coordination:  There is no decremation with RAM's. Normal finger to nose  ?Gait and Station: The patient has no difficulty arising out of a deep-seated chair without the use of the hands. The patient's stride length is good.  Gait is cautious and narrow.  ? ? ?Total time spent on today's visit was 50 minutes, including both face-to-face time and nonface-to-face time. Time included that spent on review of records (prior notes available to me/labs/imaging if pertinent), discussing treatment and goals, answering patient's questions and coordinating care. ? ?Cc:  Hoyt Koch, MD ?Sharene Butters, PA-C   ?

## 2021-09-13 LAB — HIV ANTIBODY (ROUTINE TESTING W REFLEX): HIV 1&2 Ab, 4th Generation: NONREACTIVE

## 2021-09-13 LAB — RPR: RPR Ser Ql: NONREACTIVE

## 2021-09-13 LAB — AMMONIA: Ammonia: 21 umol/L (ref <=72)

## 2021-09-14 LAB — ANA W/REFLEX: Anti Nuclear Antibody (ANA): NEGATIVE

## 2021-09-18 ENCOUNTER — Other Ambulatory Visit (HOSPITAL_COMMUNITY)
Admission: RE | Admit: 2021-09-18 | Discharge: 2021-09-18 | Disposition: A | Discharge status: Home / Self Care | Payer: Medicare HMO | Source: Ambulatory Visit | Attending: Physician Assistant | Admitting: Physician Assistant

## 2021-09-18 ENCOUNTER — Ambulatory Visit
Admission: RE | Admit: 2021-09-18 | Discharge: 2021-09-18 | Disposition: A | Discharge status: Home / Self Care | Payer: Medicare HMO | Source: Ambulatory Visit | Attending: Physician Assistant | Admitting: Physician Assistant

## 2021-09-18 VITALS — BP 166/100 | HR 75

## 2021-09-18 DIAGNOSIS — Z8673 Personal history of transient ischemic attack (TIA), and cerebral infarction without residual deficits: Secondary | ICD-10-CM | POA: Diagnosis not present

## 2021-09-18 DIAGNOSIS — F0390 Unspecified dementia without behavioral disturbance: Secondary | ICD-10-CM | POA: Insufficient documentation

## 2021-09-18 DIAGNOSIS — R202 Paresthesia of skin: Secondary | ICD-10-CM | POA: Insufficient documentation

## 2021-09-18 DIAGNOSIS — F03B0 Unspecified dementia, moderate, without behavioral disturbance, psychotic disturbance, mood disturbance, and anxiety: Secondary | ICD-10-CM | POA: Diagnosis not present

## 2021-09-18 DIAGNOSIS — R200 Anesthesia of skin: Secondary | ICD-10-CM | POA: Diagnosis not present

## 2021-09-18 DIAGNOSIS — R413 Other amnesia: Secondary | ICD-10-CM | POA: Diagnosis not present

## 2021-09-18 DIAGNOSIS — I10 Essential (primary) hypertension: Secondary | ICD-10-CM

## 2021-09-18 NOTE — Discharge Instructions (Signed)

## 2021-09-18 NOTE — Progress Notes (Signed)
Serum collected (1 tube) from pts LAC to be sent with LP blood work. Pt tolerated well. 1 successful attempt. Gauze and tape applied after.   ?

## 2021-09-19 LAB — CYTOLOGY - NON PAP

## 2021-09-21 ENCOUNTER — Ambulatory Visit (INDEPENDENT_AMBULATORY_CARE_PROVIDER_SITE_OTHER): Payer: Medicare HMO

## 2021-09-21 ENCOUNTER — Other Ambulatory Visit: Payer: Self-pay | Admitting: Cardiology

## 2021-09-21 ENCOUNTER — Encounter: Payer: Self-pay | Admitting: Cardiology

## 2021-09-21 ENCOUNTER — Ambulatory Visit: Payer: Medicare HMO | Admitting: Cardiology

## 2021-09-21 VITALS — BP 148/88 | HR 73 | Ht 64.0 in | Wt 114.6 lb

## 2021-09-21 DIAGNOSIS — Z8673 Personal history of transient ischemic attack (TIA), and cerebral infarction without residual deficits: Secondary | ICD-10-CM

## 2021-09-21 DIAGNOSIS — I4891 Unspecified atrial fibrillation: Secondary | ICD-10-CM

## 2021-09-21 DIAGNOSIS — I639 Cerebral infarction, unspecified: Secondary | ICD-10-CM

## 2021-09-21 NOTE — Progress Notes (Signed)
?  ?Cardiology Office Note ? ? ?Date:  09/21/2021  ? ?ID:  Gina King, DOB 1946-05-01, MRN [id number] ? ?PCP:  Hoyt Koch, MD  ?Cardiologist:   None ? ? ?Chief Complaint  ?Patient presents with  ? Cerebrovascular Accident  ? ? ?  ?History of Present Illness: ?Gina King is a 76 y.o. female who presents for evaluation of strokes.  She is referred by  Rondel Jumbo, PA-C she has been seen by neurology because of dementia and has had an extensive work-up when she is found to have microvascular ischemic changes superimposed on small chronic infarcts.  She is sent here to evaluate possible ischemic strokes. ? ?The patient's had no past cardiac history.  I do see that her blood pressure has been elevated 3 times in a row at recent visits.  She has some dyslipidemia and was started on statin in February.  However, she has not had any cardiac work-up.  She lives at home with her husband.  She is up and down stairs 12 times a day.  She does not describe chest pressure, neck or arm discomfort.  She does not have shortness of breath, PND or orthopnea.  She does not notice palpitations, presyncope or syncope.  However, her history is compromised by the fact that she has short-term memory disorder. ? ? ?Past Medical History:  ?Diagnosis Date  ? CVA (cerebral vascular accident)   ? 07/2021 MRI - Moderate chronic microvascular ischemic changes with a few superimposed chronic small vessel infarcts  ? GERD (gastroesophageal reflux disease) 12/30/2006  ? Mixed hyperlipidemia 07/28/2007  ? Moderate dementia, unclear etiology 08/25/2021  ? Numbness and tingling of left leg 11/30/2020  ? Osteopenia 12/30/2006  ? Restless leg syndrome 06/23/2021  ? Vitamin D deficiency 07/28/2007  ? ? ?Past Surgical History:  ?Procedure Laterality Date  ? ABDOMINAL HYSTERECTOMY  1984  ? ? ? ?Current Outpatient Medications  ?Medication Sig Dispense Refill  ? cholecalciferol (VITAMIN D) 1000 UNITS tablet Take 1,000 Units by mouth daily.    ?  FLUAD QUADRIVALENT 0.5 ML injection     ? Multiple Vitamin (MULTIVITAMIN) tablet Take 1 tablet by mouth daily.    ? pantoprazole (PROTONIX) 20 MG tablet TAKE 1 TABLET EVERY DAY 90 tablet 0  ? PFIZER COVID-19 VAC BIVALENT injection     ? rosuvastatin (CRESTOR) 20 MG tablet Take 1 tablet (20 mg total) by mouth daily. 90 tablet 3  ? vitamin B-12 (CYANOCOBALAMIN) 1000 MCG tablet Take 1,000 mcg by mouth daily. Reported on 11/07/2015    ? vitamin C (ASCORBIC ACID) 500 MG tablet Take 500 mg by mouth 2 (two) times daily.    ? ?No current facility-administered medications for this visit.  ? ? ?Allergies:   Gabapentin  ? ? ?Social History:  The patient  reports that she has never smoked. She has never used smokeless tobacco. She reports that she does not drink alcohol and does not use drugs.  ? ?Family History:  The patient's family history includes Kidney failure in her brother; Memory loss in her brother and father.  ? ? ?ROS:  Please see the history of present illness.   Otherwise, review of systems are positive for none.   All other systems are reviewed and negative.  ? ? ?PHYSICAL EXAM: ?VS:  BP (!) 148/88   Pulse 73   Ht '5\' 4"'$  (1.626 m)   Wt 114 lb 9.6 oz (52 kg)   SpO2 96%   BMI 19.67  kg/m?  , BMI Body mass index is 19.67 kg/m?. ?GENERAL:  Well appearing ?HEENT:  Pupils equal round and reactive, fundi not visualized, oral mucosa unremarkable ?NECK:  No jugular venous distention, waveform within normal limits, carotid upstroke brisk and symmetric, no bruits, no thyromegaly ?LYMPHATICS:  No cervical, inguinal adenopathy ?LUNGS:  Clear to auscultation bilaterally ?BACK:  No CVA tenderness ?CHEST:  Unremarkable ?HEART:  PMI not displaced or sustained,S1 and S2 within normal limits, no S3, no S4, no clicks, no rubs, no murmurs ?ABD:  Flat, positive bowel sounds normal in frequency in pitch, no bruits, no rebound, no guarding, no midline pulsatile mass, no hepatomegaly, no splenomegaly ?EXT:  2 plus pulses throughout, no  edema, no cyanosis no clubbing ?SKIN:  No rashes no nodules ?NEURO:  Cranial nerves II through XII grossly intact, motor grossly intact throughout ?PSYCH:  Cognitively intact, oriented to person place and time ? ? ? ?EKG:  EKG is ordered today. ?The ekg ordered today demonstrates sinus rhythm, rate 73, axis within normal limits, intervals within normal limits, no acute ST-T wave changes. ? ? ?Recent Labs: ?11/28/2020: TSH 0.83 ?09/12/2021: ALT 12; BUN 13; Creatinine, Ser 0.80; Hemoglobin 14.6; Platelets 235.0; Potassium 4.2; Sodium 141  ? ? ?Lipid Panel ?   ?Component Value Date/Time  ? CHOL 238 (H) 08/29/2020 1335  ? TRIG 169.0 (H) 08/29/2020 1335  ? HDL 60.50 08/29/2020 1335  ? CHOLHDL 4 08/29/2020 1335  ? VLDL 33.8 08/29/2020 1335  ? LDLCALC 143 (H) 08/29/2020 1335  ? LDLDIRECT 108.0 11/07/2015 1350  ? ?  ? ?Wt Readings from Last 3 Encounters:  ?09/21/21 114 lb 9.6 oz (52 kg)  ?09/12/21 116 lb (52.6 kg)  ?07/28/21 113 lb (51.3 kg)  ?  ? ? ?Other studies Reviewed: ?Additional studies/ records that were reviewed today include: Neurology notes, MRI. ?Review of the above records demonstrates:  Please see elsewhere in the note.   ? ? ?ASSESSMENT AND PLAN: ? ?CVA: There is a question of ischemic infarcts.  This does not look like embolic source but I will chec a monitor to make sure there were no arrhythmias.  When we see her back I might consider an echocardiogram although I suspect a structurally normal heart from her exam and absence of symptoms.  No change in therapy at this point.  I will defer to neurology whether they think aspirin is indicated ? ?HTN: Her blood pressure is mildly elevated.  Her husband is going to bring back blood pressure cuff so we can get these working so that they can keep a home diary and she might need med titration. ? ?Dyslipidemia: She was started on a statin in February and I will have her come back for lipid profile.  I think the goal LDL should be at least less than 100. ? ? ?Current  medicines are reviewed at length with the patient today.  The patient does not have concerns regarding medicines. ? ?The following changes have been made:  no change ? ?Labs/ tests ordered today include:  ? ?Orders Placed This Encounter  ?Procedures  ? Lipid panel  ? LONG TERM MONITOR (3-14 DAYS)  ? EKG 12-Lead  ? ? ? ?Disposition:   FU with APP in 3 months.   ? ? ?Signed, ?Minus Breeding, MD  ?09/21/2021 12:18 PM    ?[place] ? ? ? ?

## 2021-09-21 NOTE — Patient Instructions (Addendum)
Medication Instructions:  ?No changes ?*If you need a refill on your cardiac medications before your next appointment, please call your pharmacy* ? ? ?Lab Work: ?Your provider would like for you to return in when you have the nurse visit to have the following labs drawn: fasting Lipid. You do not need an appointment for the lab. Once in our office lobby there is a podium where you can sign in and ring the doorbell to alert [place] that you are here. The lab is open from 8:00 am to 4 pm; closed for lunch from 12:45pm-1:45pm. ? ?You may also go to any of these [facility] locations: ?  ?[place] ?- [place] ([facility]) ?- [address] ?- [place]  ?[place] ?- [address]  ?  ?[place]  ?- [place] [address]  ?  ?[place] ?- [place]  ?[place]  ?- [place] ?- [place] [address] ([facility] ? ?If you have labs (blood work) drawn today and your tests are completely normal, you will receive your results only by: ?MyChart Message (if you have MyChart) OR ?A paper copy in the mail ?If you have any lab test that is abnormal or we need to change your treatment, we will call you to review the results. ? ? ?Testing/Procedures: ?ZIO XT- Long Term Monitor Instructions ? ?Your physician has requested you wear a ZIO patch monitor for 14 days.  ?This is a single patch monitor. [employer] supplies one patch monitor per enrollment. Additional ?stickers are not available. Please do not apply patch if you will be having a Nuclear Stress Test,  ?Echocardiogram, Cardiac CT, MRI, or Chest Xray during the period you would be wearing the  ?monitor. The patch cannot be worn during these tests. You cannot remove and re-apply the  ?ZIO XT patch monitor.  ?Your ZIO patch monitor will be mailed 3 day USPS to your address on file. It may take 3-5 days  ?to receive your monitor after you have been  enrolled.  ?Once you have received your monitor, please review the enclosed instructions. Your monitor  ?has already been registered assigning a specific monitor serial # to you. ? ?Billing and Patient Assistance Program Information ? ?We have supplied [employer] with any of your insurance information on file for billing purposes. ?[employer] offers a sliding scale Patient Assistance Program for patients that do not have  ?insurance, or whose insurance does not completely cover the cost of the ZIO monitor.  ?You must apply for the Patient Assistance Program to qualify for this discounted rate.  ?To apply, please call [employer] at [phone], select option 4, select option 2, ask to apply for  ?Patient Assistance Program. Theodore Demark will ask your household income, and how many people  ?are in your household. They will quote your out-of-pocket cost based on that information.  ?[employer] will also be able to set up a 48-month interest-free payment plan if needed. ? ?Applying the monitor ?  ?Shave hair from upper left chest.  ?Hold abrader disc by orange tab. Rub abrader in 40 strokes over the upper left chest as  ?indicated in your monitor instructions.  ?Clean area with 4 enclosed alcohol pads. Let dry.  ?Apply patch as indicated in monitor instructions. Patch will be placed under collarbone on left  ?side of chest with arrow pointing upward.  ?Rub patch  adhesive wings for 2 minutes. Remove white label marked "1". Remove the white  ?label marked "2". Rub patch adhesive wings for 2 additional minutes.  ?While looking in a mirror, press and release button in center of patch. A small green light will  ?flash 3-4 times. This will be your only indicator that the monitor has been turned on.  ?Do not shower for the first 24 hours. You may shower after the first 24 hours.  ?Press the button if you feel a symptom. You will hear a small click. Record Date, Time and  ?Symptom in the Patient Logbook.  ?When you are ready to remove the  patch, follow instructions on the last 2 pages of Patient  ?Logbook. Stick patch monitor onto the last page of Patient Logbook.  ?Place Patient Logbook in the blue and white box. Use locking tab on box and tape box closed  ?securely. The blue and white box has prepaid postage on it. Please place it in the mailbox as  ?soon as possible. Your physician should have your test results approximately 7 days after the  ?monitor has been mailed back to [facility].  ?Call [facility] at [phone] if you have questions regarding  ?your ZIO XT patch monitor. Call them immediately if you see an orange light blinking on your  ?monitor.  ?If your monitor falls off in less than 4 days, contact our Monitor department at [phone].  ?If your monitor becomes loose or falls off after 4 days call [employer] at [phone] for  ?suggestions on securing your monitor ? ? ? ?Follow-Up: ?At [facility], you and your health needs are our priority.  As part of our continuing mission to provide you with exceptional heart care, we have created designated Provider Care Teams.  These Care Teams include your primary Cardiologist (physician) and Advanced Practice Providers (APPs -  Physician Assistants and Nurse Practitioners) who all work together to provide you with the care you need, when you need it. ? ?We recommend signing up for the patient portal called "MyChart".  Sign up information is provided on this After Visit Summary.  MyChart is used to connect with patients for Virtual Visits (Telemedicine).  Patients are able to view lab/test results, encounter notes, upcoming appointments, etc.  Non-urgent messages can be sent to your provider as well.   ?To learn more about what you can do with MyChart, go to [email or web address].   ? ?Your next appointment:   ?Follow up for a Nurse Visit next week for Blood Pressure check and blood pressure machine check. Please bring you blood pressure machine.  ? ?Follow up in  2 months with APP ? ? ? ?

## 2021-09-21 NOTE — Progress Notes (Unsigned)
Enrolled for [employer] to mail a ZIO XT long term holter monitor to the patients address on file.  

## 2021-09-22 ENCOUNTER — Encounter: Payer: Self-pay | Admitting: Internal Medicine

## 2021-09-22 ENCOUNTER — Ambulatory Visit (INDEPENDENT_AMBULATORY_CARE_PROVIDER_SITE_OTHER): Payer: Medicare HMO | Admitting: Internal Medicine

## 2021-09-22 VITALS — BP 132/70 | HR 67 | Resp 18 | Ht 64.0 in | Wt 112.4 lb

## 2021-09-22 DIAGNOSIS — K219 Gastro-esophageal reflux disease without esophagitis: Secondary | ICD-10-CM

## 2021-09-22 DIAGNOSIS — Z8673 Personal history of transient ischemic attack (TIA), and cerebral infarction without residual deficits: Secondary | ICD-10-CM | POA: Diagnosis not present

## 2021-09-22 DIAGNOSIS — F03B0 Unspecified dementia, moderate, without behavioral disturbance, psychotic disturbance, mood disturbance, and anxiety: Secondary | ICD-10-CM | POA: Diagnosis not present

## 2021-09-22 DIAGNOSIS — E782 Mixed hyperlipidemia: Secondary | ICD-10-CM

## 2021-09-22 DIAGNOSIS — Z0001 Encounter for general adult medical examination with abnormal findings: Secondary | ICD-10-CM | POA: Diagnosis not present

## 2021-09-22 NOTE — Assessment & Plan Note (Signed)
Flu shot yearly. Covid-19 up to date. Pneumonia complete. Shingrix complete. Tetanus due advised to get at pharmacy. Colonoscopy due 2025. Mammogram due 2025, pap smear aged out and dexa due 2025-2027. Counseled about sun safety and mole surveillance. Counseled about the dangers of distracted driving. Given 10 year screening recommendations.  ? ?

## 2021-09-22 NOTE — Progress Notes (Signed)
? ?  Subjective:  ? ?Patient ID: Gina King, female    DOB: 09/08/1945, 76 y.o.   MRN: [id number] ? ?HPI ?The patient is a 76 YO female coming in for physical.  ? ?Review of Systems  ?Unable to perform ROS: Dementia  ?Constitutional: Negative.   ?HENT: Negative.    ?Eyes: Negative.   ?Respiratory:  Negative for cough, chest tightness and shortness of breath.   ?Cardiovascular:  Negative for chest pain, palpitations and leg swelling.  ?Gastrointestinal:  Negative for abdominal distention, abdominal pain, constipation, diarrhea, nausea and vomiting.  ?Musculoskeletal: Negative.   ?Skin: Negative.   ?Neurological: Negative.   ?Psychiatric/Behavioral: Negative.    ? ?Objective:  ?Physical Exam ?Constitutional:   ?   Appearance: She is well-developed.  ?HENT:  ?   Head: Normocephalic and atraumatic.  ?Cardiovascular:  ?   Rate and Rhythm: Normal rate and regular rhythm.  ?Pulmonary:  ?   Effort: Pulmonary effort is normal. No respiratory distress.  ?   Breath sounds: Normal breath sounds. No wheezing or rales.  ?Abdominal:  ?   General: Bowel sounds are normal. There is no distension.  ?   Palpations: Abdomen is soft.  ?   Tenderness: There is no abdominal tenderness. There is no rebound.  ?Musculoskeletal:  ?   Cervical back: Normal range of motion.  ?Skin: ?   General: Skin is warm and dry.  ?Neurological:  ?   Mental Status: She is alert and oriented to person, place, and time.  ?   Coordination: Coordination normal.  ? ? ?Vitals:  ? 09/22/21 1538  ?BP: 132/70  ?Pulse: 67  ?Resp: 18  ?SpO2: 98%  ?Weight: 112 lb 6.4 oz (51 kg)  ?Height: '5\' 4"'$  (1.626 m)  ? ? ?This visit occurred during the SARS-CoV-2 public health emergency.  Safety protocols were in place, including screening questions prior to the visit, additional usage of staff PPE, and extensive cleaning of exam room while observing appropriate contact time as indicated for disinfecting solutions.  ? ?Assessment & Plan:  ? ?

## 2021-09-22 NOTE — Assessment & Plan Note (Signed)
Following with neurology and all LP results are not back yet. Ones back thus far are normal.  ?

## 2021-09-22 NOTE — Assessment & Plan Note (Signed)
Taking crestor 20 mg daily and will continue. Needs follow up lipid panel around June or July. Goal LDL <100 (<70 ideal).  ?

## 2021-09-22 NOTE — Patient Instructions (Signed)
You can get a tetanus booster at the pharmacy. ?

## 2021-09-22 NOTE — Assessment & Plan Note (Signed)
Taking protonix 20 mg daily as needed for GERD and adequate control. Continue. ?

## 2021-09-22 NOTE — Assessment & Plan Note (Signed)
Taking crestor 20 mg daily and needs recheck in June or July (either cardiology or [place] can order this). Goal LDL <100 (<70 ideal).  ?

## 2021-09-23 DIAGNOSIS — Z8673 Personal history of transient ischemic attack (TIA), and cerebral infarction without residual deficits: Secondary | ICD-10-CM

## 2021-09-23 DIAGNOSIS — I4891 Unspecified atrial fibrillation: Secondary | ICD-10-CM

## 2021-09-23 DIAGNOSIS — I6389 Other cerebral infarction: Secondary | ICD-10-CM

## 2021-09-23 LAB — HERPES SIMPLEX VIRUS, TYPE 1 AND 2 DNA,QUAL,RT PCR
HSV 1 DNA: NOT DETECTED
HSV 2 DNA: NOT DETECTED

## 2021-09-25 ENCOUNTER — Telehealth: Payer: Self-pay | Admitting: Physician Assistant

## 2021-09-25 NOTE — Telephone Encounter (Signed)
The following message was left with [employer] on 09/24/21 at 4:17 PM. ? ?Caller states calling to report critical labs.  ? ?See detailed printout in doctor's inbox.  ?

## 2021-09-26 NOTE — Telephone Encounter (Signed)
I left message that results are not complete at this time, will call as soon as results are resulted.   ?

## 2021-09-27 ENCOUNTER — Other Ambulatory Visit: Payer: Medicare HMO

## 2021-09-27 DIAGNOSIS — I10 Essential (primary) hypertension: Secondary | ICD-10-CM

## 2021-09-27 NOTE — Progress Notes (Unsigned)
? ?  Nurse Visit  ? ?Date of Encounter: 09/27/2021 ?ID: Gina King, DOB [birth year], MRN [id number] ? ?PCP:  Hoyt Koch, MD ?  ?[facility] Providers ?Cardiologist:  Minus Breeding, MD { ?Click to update primary MD,subspecialty MD or APP then REFRESH:1}   ? ? ?Visit Details  ? ?VS:  BP (!) 142/78 (BP Location: Right Arm, Patient Position: Sitting, Cuff Size: Normal)   Pulse 87   Ht '5\' 3"'$  (1.6 m)   Wt 114 lb (51.7 kg)   SpO2 97%   BMI 20.19 kg/m?  , BMI Body mass index is 20.19 kg/m?. ? ?Wt Readings from Last 3 Encounters:  ?09/27/21 114 lb (51.7 kg)  ?09/22/21 112 lb 6.4 oz (51 kg)  ?09/21/21 114 lb 9.6 oz (52 kg)  ?  ? ?Reason for visit: blood pressure check and personal bp machine check ?Performed today: see Vitals in chart, EKG: N/A, Provider consulted N/A, and Education: bp procedure. Explained to patient, and spouse on proper cuff placement, to sit and rest for 5 minutes before checking bp , and provided bp record sheet. Assess her cuff against clinic cuff. Patient's bp monitor readings were 129/65, 88/45. Suggested they obtain a new cuff. Reviewed medication list with patient and spouse.  Will send clinic notes to Dr. Percival Spanish. ?Changes (medications, testing, etc.) : N/A ?Length of Visit: 30 minutes. ? ? ? ?Medications Adjustments/Labs and Tests Ordered: ?No changes made at present time. Will forward to Dr. Percival Spanish ? ? ? ?Signed, ?Kathreen Devoid, RN  ?09/27/2021 2:27 PM ? ? ?

## 2021-10-02 LAB — MAYO MISC ORDER 3

## 2021-10-03 NOTE — Telephone Encounter (Signed)
Has this been completed?  Sending to clinical staff for review: Okay to sign/close encounter or is further follow up needed? 

## 2021-10-13 DIAGNOSIS — I639 Cerebral infarction, unspecified: Secondary | ICD-10-CM | POA: Diagnosis not present

## 2021-10-13 DIAGNOSIS — Z8673 Personal history of transient ischemic attack (TIA), and cerebral infarction without residual deficits: Secondary | ICD-10-CM | POA: Diagnosis not present

## 2021-10-13 DIAGNOSIS — I4891 Unspecified atrial fibrillation: Secondary | ICD-10-CM | POA: Diagnosis not present

## 2021-10-17 LAB — CRYPTOCOCCAL AG, LTX SCR RFLX TITER
Cryptococcal Ag Screen: NOT DETECTED
MICRO NUMBER:: 13272104

## 2021-10-17 LAB — CSF CULTURE W GRAM STAIN
GRAM STAIN:: NONE SEEN
MICRO NUMBER:: 13272106
Result:: NO GROWTH

## 2021-10-17 LAB — MAYO MISC ORDER,CSF
Miscellaneous Test Results: NEGATIVE
NORMAL RANGE:: NEGATIVE
PRICE:: 125

## 2021-10-17 LAB — FUNGUS CULTURE W SMEAR
CULTURE:: NO GROWTH
MICRO NUMBER:: 13272105
SMEAR:: NONE SEEN
SPECIMEN QUALITY:: ADEQUATE

## 2021-10-17 LAB — MAYO MISC ORDER: PRICE:: 1691.3

## 2021-10-17 LAB — CNS IGG SYNTHESIS RATE, CSF+BLOOD
Albumin Serum: 4.3 g/dL (ref 3.6–5.1)
Albumin, CSF: 17.5 mg/dL (ref 8.0–42.0)
CNS-IgG Synthesis Rate: -3 mg/24 h (ref <=3.3)
IgG (Immunoglobin G), Serum: 1160 mg/dL (ref 600–1540)
IgG Total CSF: 2.4 mg/dL (ref 0.8–7.7)
IgG-Index: 0.51 (ref <0.70)

## 2021-10-17 LAB — PROTEIN, CSF 14-3-3 (PRION DISEASE)
14-3-3 PROTEIN (CSF)++: 5725 AU/ml — ABNORMAL HIGH (ref 30–1999)
EST PROB PRION DIS IN PATIENT: <1 %
RT-QUIC (CSF)*: NEGATIVE
T-TAU PROTEIN (CSF)++: 953 pg/ml (ref 0–1149)

## 2021-10-17 LAB — WEST NILE AB, IGG AND IGM, CSF
West Nile Ab, IgG, CSF: <1.3 index (ref <1.30)
West Nile Ab, IgM, CSF: <0.9 index (ref <0.90)

## 2021-10-17 LAB — CMV (CYTOMEGALOVIRUS) DNA ULTRAQUANT, PCR
CMV DNA, QN PCR: NOT DETECTED log IU/mL
CMV DNA, QN Real Time PCR: NOT DETECTED IU/mL

## 2021-10-17 LAB — CSF CELL COUNT WITH DIFFERENTIAL
RBC Count, CSF: 0 cells/uL
TOTAL NUCLEATED CELL: 0 cells/uL (ref 0–5)

## 2021-10-17 LAB — LYME DISEASE ABS IGG, IGM, IFA, CSF
Lyme Disease AB (IgG), IBL: NOT DETECTED
Lyme Disease AB (IgM), IBL: NOT DETECTED

## 2021-10-17 LAB — VDRL, CSF: VDRL Quant, CSF: NONREACTIVE

## 2021-10-17 LAB — MAYO MISC ORDER 2: PRICE:: 0

## 2021-10-17 LAB — MYELIN BASIC PROTEIN, CSF: Myelin Basic Protein: <2 mcg/L (ref <=4.0)

## 2021-10-17 LAB — ANGIOTENSIN CONVERTING ENZYME, CSF: ANGIOTENSIN CONVERTING ENZYME ( ACE) CSF: 6 U/L (ref <=15)

## 2021-10-17 LAB — PROTEIN, CSF: Total Protein, CSF: 32 mg/dL (ref 15–60)

## 2021-10-17 LAB — OLIGOCLONAL BANDS, CSF + SERM

## 2021-10-17 LAB — GLUCOSE, CSF: Glucose, CSF: 56 mg/dL (ref 40–80)

## 2021-10-18 ENCOUNTER — Encounter: Payer: Self-pay | Admitting: *Deleted

## 2021-10-26 ENCOUNTER — Other Ambulatory Visit: Payer: Self-pay | Admitting: Internal Medicine

## 2021-11-21 ENCOUNTER — Ambulatory Visit: Payer: Medicare HMO | Admitting: Nurse Practitioner

## 2021-11-21 ENCOUNTER — Encounter: Payer: Self-pay | Admitting: Nurse Practitioner

## 2021-11-21 VITALS — BP 120/78 | HR 84 | Ht 63.0 in | Wt 118.6 lb

## 2021-11-21 DIAGNOSIS — Z8673 Personal history of transient ischemic attack (TIA), and cerebral infarction without residual deficits: Secondary | ICD-10-CM | POA: Diagnosis not present

## 2021-11-21 DIAGNOSIS — E785 Hyperlipidemia, unspecified: Secondary | ICD-10-CM

## 2021-11-21 DIAGNOSIS — R413 Other amnesia: Secondary | ICD-10-CM | POA: Diagnosis not present

## 2021-11-21 DIAGNOSIS — I10 Essential (primary) hypertension: Secondary | ICD-10-CM | POA: Diagnosis not present

## 2021-11-21 NOTE — Patient Instructions (Signed)
Medication Instructions:  Your physician recommends that you continue on your current medications as directed. Please refer to the Current Medication list given to you today.   *If you need a refill on your cardiac medications before your next appointment, please call your pharmacy*   Lab Work: Your physician recommends that you return for lab work in 1-2 weeks Lipid Lfts  If you have labs (blood work) drawn today and your tests are completely normal, you will receive your results only by: [place] (if you have MyChart) OR A paper copy in the mail If you have any lab test that is abnormal or we need to change your treatment, we will call you to review the results.   Testing/Procedures: NONE ordered at this time of appointment     Follow-Up: At [facility], you and your health needs are our priority.  As part of our continuing mission to provide you with exceptional heart care, we have created designated Provider Care Teams.  These Care Teams include your primary Cardiologist (physician) and Advanced Practice Providers (APPs -  Physician Assistants and Nurse Practitioners) who all work together to provide you with the care you need, when you need it.  We recommend signing up for the patient portal called "MyChart".  Sign up information is provided on this After Visit Summary.  MyChart is used to connect with patients for Virtual Visits (Telemedicine).  Patients are able to view lab/test results, encounter notes, upcoming appointments, etc.  Non-urgent messages can be sent to your provider as well.   To learn more about what you can do with MyChart, go to [email or web address].    Your next appointment:   4-6 month(s)  The format for your next appointment:   In Person  Provider:   Minus Breeding, MD     Other Instructions   Important Information About Sugar

## 2021-11-21 NOTE — Progress Notes (Signed)
Office Visit    Patient Name: Gina King Date of Encounter: 11/21/2021  Primary Care Provider:  Hoyt Koch, MD Primary Cardiologist:  Minus Breeding, MD  Chief Complaint    76 year old female with a history of CVA, hypertension, hyperlipidemia, memory impairment, restless leg syndrome, osteopenia and GERD who presents for follow-up related to hypertension, hyperlipidemia, and  h/o CVA.  Past Medical History    Past Medical History:  Diagnosis Date   CVA (cerebral vascular accident)    07/2021 MRI - Moderate chronic microvascular ischemic changes with a few superimposed chronic small vessel infarcts   GERD (gastroesophageal reflux disease) 12/30/2006   Mixed hyperlipidemia 07/28/2007   Moderate dementia, unclear etiology 08/25/2021   Numbness and tingling of left leg 11/30/2020   Osteopenia 12/30/2006   Restless leg syndrome 06/23/2021   Vitamin D deficiency 07/28/2007   Past Surgical History:  Procedure Laterality Date   ABDOMINAL HYSTERECTOMY  1984    Allergies  Allergies  Allergen Reactions   Gabapentin     REACTION: withdrawal symtoms when didnt wean    History of Present Illness    76 year old female with the above past medical history including CVA, hypertension, hyperlipidemia, memory impairment, restless leg syndrome, osteopenia and GERD.  She has no prior cardiac history.  She has been followed by neurology for history of microvascular ischemic changes superimposed on small chronic infarcts.  She was started on statin therapy in February 2023. History of short-term memory impairment.  She was evaluated by Dr. Percival Spanish on 09/21/2021 for recent CVA, concern for embolic stroke.  BP was mildly elevated at the time. 14-day ZIO monitor showed no significant arrhythmias, no evidence of atrial fibrillation.  She presents today for follow-up accompanied by her husband.  Since her last visit she has done well from a cardiac standpoint. Denies any recurrent  symptoms concerning for stroke, denies palpitations, dizziness, presyncope, syncope. She denies symptoms concerning for angina. Her husband states he has seen some improvement in her memory since starting Aricept. She is following with neurology. Overall, she reports feeling well and denies any new concerns today.   Home Medications    Current Outpatient Medications  Medication Sig Dispense Refill   cholecalciferol (VITAMIN D) 1000 UNITS tablet Take 1,000 Units by mouth daily.     donepezil (ARICEPT) 10 MG tablet Take 5 mg by mouth at bedtime.     Multiple Vitamin (MULTIVITAMIN) tablet Take 1 tablet by mouth daily.     pantoprazole (PROTONIX) 20 MG tablet TAKE 1 TABLET EVERY DAY 90 tablet 0   rosuvastatin (CRESTOR) 20 MG tablet Take 1 tablet (20 mg total) by mouth daily. 90 tablet 3   vitamin B-12 (CYANOCOBALAMIN) 1000 MCG tablet Take 1,000 mcg by mouth daily. Reported on 11/07/2015     vitamin C (ASCORBIC ACID) 500 MG tablet Take 500 mg by mouth 2 (two) times daily.     No current facility-administered medications for this visit.     Review of Systems    She denies chest pain, palpitations, dyspnea, pnd, orthopnea, n, v, dizziness, syncope, edema, weight gain, or early satiety. All other systems reviewed and are otherwise negative except as noted above.   Physical Exam    VS:  BP 120/78   Pulse 84   Ht '5\' 3"'$  (1.6 m)   Wt 118 lb 9.6 oz (53.8 kg)   SpO2 99%   BMI 21.01 kg/m   GEN: Well nourished, well developed, in no acute distress. HEENT: normal. Neck:  Supple, no JVD, carotid bruits, or masses. Cardiac: RRR, no murmurs, rubs, or gallops. No clubbing, cyanosis, edema.  Radials/DP/PT 2+ and equal bilaterally.  Respiratory:  Respirations regular and unlabored, clear to auscultation bilaterally. GI: Soft, nontender, nondistended, BS + x 4. MS: no deformity or atrophy. Skin: warm and dry, no rash. Neuro:  Strength and sensation are intact. Psych: Normal affect.  Accessory Clinical  Findings    ECG personally reviewed by me today - No EKG in office today.   Lab Results  Component Value Date   WBC 5.0 09/12/2021   HGB 14.6 09/12/2021   HCT 43.6 09/12/2021   MCV 93.9 09/12/2021   PLT 235.0 09/12/2021   Lab Results  Component Value Date   CREATININE 0.80 09/12/2021   BUN 13 09/12/2021   NA 141 09/12/2021   K 4.2 09/12/2021   CL 103 09/12/2021   CO2 31 09/12/2021   Lab Results  Component Value Date   ALT 12 09/12/2021   AST 17 09/12/2021   ALKPHOS 53 09/12/2021   BILITOT 0.5 09/12/2021   Lab Results  Component Value Date   CHOL 238 (H) 08/29/2020   HDL 60.50 08/29/2020   LDLCALC 143 (H) 08/29/2020   LDLDIRECT 108.0 11/07/2015   TRIG 169.0 (H) 08/29/2020   CHOLHDL 4 08/29/2020    Lab Results  Component Value Date   HGBA1C 5.7 12/16/2018    Assessment & Plan   1. Hypertension: BP slightly above goal upon arrival today, recheck 120/78.  BP has been well controlled at home.  She is not currently on any antihypertensive medication.  2. Hyperlipidemia: LDL was 143 in 08/2021.  Recently started on Crestor.  Will repeat lipids, LFTs.   3. H/o CVA: No recurrent symptoms. There was question for ischemic infarct.  14-day ZIO monitor showed no significant arrhythmias, no evidence of atrial fibrillation.  I do not see an indication for echocardiogram at this time.  She is not on aspirin.  Will defer to neurology whether they think aspirin is indicated.     4. Memory impairment: Has noted some improvement since starting Aricept.  Following with neurology.   5. Disposition: Follow-up in 4-6 months with Dr. Percival Spanish.  Lenna Sciara, NP 11/21/2021, 1:32 PM

## 2021-11-22 ENCOUNTER — Telehealth: Payer: Self-pay | Admitting: Physician Assistant

## 2021-11-22 NOTE — Telephone Encounter (Signed)
Mailbox is full, sister is not on DPR. Will try and call again 2:35pm

## 2021-11-22 NOTE — Telephone Encounter (Signed)
Patients sister in law called and stated that Briena has been taking the donepezil 10 mg.  She states that it seems to wear off in the afternoon.  They wanted to see if the dosage could be raised?

## 2021-11-23 NOTE — Telephone Encounter (Signed)
No answer at 953 11/23/2021

## 2021-11-23 NOTE — Telephone Encounter (Signed)
My chart message sent at 11/23/2021.

## 2022-01-02 ENCOUNTER — Encounter: Payer: Medicare HMO | Admitting: Psychology

## 2022-01-08 ENCOUNTER — Encounter: Payer: Medicare HMO | Admitting: Psychology

## 2022-03-13 NOTE — Progress Notes (Signed)
Assessment/Plan:   Moderate dementia likely due mixed vascular and Alzheimer's disease   Gina King is a very pleasant 76 y.o. RH female seen today in follow up for memory loss. Patient is currently on donepezil 5 mg twice daily. MRI brain personally reviewed was remarkable for generalized parenchymal volume loss, moderate chronic microvascular ischemic changes likely with few superimposed chronic small vessel infarcts.  Cognitively, short-term memory is worse especially late afternoon.  Unable to perform MMSE.    Follow up in 6  months. Continue donepezil 10 mg daily Add memantine 10 mg nightly, may need to increase to twice daily in the near future    Subjective:    Any changes in memory since last visit?  Her husband reports that the short-term memory may be worse in the late afternoon.  During the day, she does well.  She reports that her long-term memory is better than the short-term memory.  She continues to read extensively.  She does not participate much in activities outside of the house. repeats oneself?  Endorsed Disoriented when walking into a room?  Patient denies   Leaving objects in unusual places?  Patient denies   Ambulates  with difficulty?   Patient denies   Recent falls?  Patient denies   Any head injuries?  Patient denies   History of seizures?   Patient denies   Wandering behavior?  Patient denies   Patient drives?   Patient no longer drives  Any mood changes since last visit?  Patient denies   Any worsening depression?:  Patient denies   Hallucinations?  Patient denies   Paranoia?  Patient denies   Patient reports that sleeps well without vivid dreams, REM behavior or sleepwalking   History of sleep apnea?  Patient denies   Any hygiene concerns?  Patient denies   Independent of bathing and dressing?  Endorsed  Does the patient needs help with medications?  Husband in charge Who is in charge of the finances?  Husband is in charge    Any changes in  appetite?  Patient denies   Patient have trouble swallowing? Patient denies   Any headaches?  Patient denies   Double vision? Patient denies   Any focal numbness or tingling?  Patient denies   Chronic back pain Patient denies   Unilateral weakness?  Patient denies   Any tremors?  Patient denies   Any history of anosmia?  Patient denies   Any incontinence of urine?  Patient denies   Any bowel dysfunction?   Patient denies      Patient lives with: HUSBANF  Initial visit 4/11/2023[name] is a very pleasant 76 y.o. RH female  seen today in follow up for memory loss. This patient is accompanied in the office by her husband who supplements the history.  Previous records as well as any outside records available were reviewed prior to todays visit.  Patient underwent neurocognitive testing yielding a diagnosis of moderate dementia without behavioral disturbance.  She has been experiencing memory issues for the last 4 or 5 months.  Since that time, she the patient repeats herself, has begun to be more argumentative "she hates to be wrong ".  She admits to mood changes, but denies hallucinations or paranoia.  Her husband and her sister-in-law are the main caretakers.  There are no hygiene concerns.  Her medications are in a pillbox, her husband monitors them.  He is also in charge of the finances at this time.  Her appetite  is good, denies trouble swallowing.  She does not cook.  She enjoys doing work finding, crossword puzzles, and until 5 years ago, she was a Archivist trouble swallowing.  She does not cook.  She ambulates without difficulty, denies any falls or head injuries.  She continues to drive without getting lost.  She denies any headache, double vision, dizziness, focal numbness or tingling, unilateral weakness, tremors or anosmia.  She has restless leg syndrome.  No history of seizures, denies urine incontinence, retention, constipation or diarrhea.   MRI of the brain 07/16/2021,  personally reviewed, was remarkable for generalized parenchymal volume loss, moderate chronic microvascular ischemic changes likely with few superimposed chronic small vessel infarcts.   Dementia panel LP [facility] to [facility] on 09/18/2021, is positive for '14 3 3 '$ protein elevated at 5725.  Other results consistent with Alzheimer's disease.   PREVIOUS MEDICATIONS:   CURRENT MEDICATIONS:  Outpatient Encounter Medications as of 03/14/2022  Medication Sig   cholecalciferol (VITAMIN D) 1000 UNITS tablet Take 1,000 Units by mouth daily.   donepezil (ARICEPT) 10 MG tablet Take 5 mg by mouth at bedtime.   memantine (NAMENDA) 10 MG tablet Take 1 tablet (10 mg total) by mouth at bedtime.   Multiple Vitamin (MULTIVITAMIN) tablet Take 1 tablet by mouth daily.   pantoprazole (PROTONIX) 20 MG tablet TAKE 1 TABLET EVERY DAY   rosuvastatin (CRESTOR) 20 MG tablet Take 1 tablet (20 mg total) by mouth daily.   vitamin B-12 (CYANOCOBALAMIN) 1000 MCG tablet Take 1,000 mcg by mouth daily. Reported on 11/07/2015   vitamin C (ASCORBIC ACID) 500 MG tablet Take 500 mg by mouth 2 (two) times daily.   No facility-administered encounter medications on file as of 03/14/2022.        No data to display             No data to display          Objective:     PHYSICAL EXAMINATION:    VITALS:   Vitals:   03/14/22 1439  BP: (!) 150/72  Pulse: 72  Resp: 18  SpO2: 98%  Weight: 121 lb (54.9 kg)    GEN:  The patient appears stated age and is in NAD. HEENT:  Normocephalic, atraumatic.   Neurological examination:  General: NAD, well-groomed, appears stated age. Orientation: The patient is alert. Oriented to person, place and date Cranial nerves: There is good facial symmetry.The speech is fluent and clear. No aphasia or dysarthria. Fund of knowledge is appropriate. Recent and remote memory are impaired. Attention and concentration are reduced.  Able to name objects and repeat phrases.  Hearing is intact to  conversational tone.    Sensation: Sensation is intact to light touch throughout Motor: Strength is at least antigravity x4. Tremors: none  DTR's 2/4 in UE/LE     Movement examination: Tone: There is normal tone in the UE/LE Abnormal movements:  no tremor.  No myoclonus.  No asterixis.   Coordination:  There is no decremation with RAM's. Normal finger to nose  Gait and Station: The patient has no difficulty arising out of a deep-seated chair without the use of the hands. The patient's stride length is good.  Gait is cautious and narrow.    Thank you for allowing [place] the opportunity to participate in the care of this nice patient. Please do not hesitate to contact us for any questions or concerns.   Total time spent on today's visit was 24 minutes dedicated to this patient  today, preparing to see patient, examining the patient, ordering tests and/or medications and counseling the patient, documenting clinical information in the EHR or other health record, independently interpreting results and communicating results to the patient/family, discussing treatment and goals, answering patient's questions and coordinating care.  Cc:  Hoyt Koch, MD  Sharene Butters 03/15/2022 11:55 AM

## 2022-03-14 ENCOUNTER — Ambulatory Visit: Payer: Medicare HMO | Admitting: Physician Assistant

## 2022-03-14 ENCOUNTER — Encounter: Payer: Self-pay | Admitting: Physician Assistant

## 2022-03-14 VITALS — BP 150/72 | HR 72 | Resp 18 | Wt 121.0 lb

## 2022-03-14 DIAGNOSIS — G301 Alzheimer's disease with late onset: Secondary | ICD-10-CM | POA: Diagnosis not present

## 2022-03-14 DIAGNOSIS — F02B0 Dementia in other diseases classified elsewhere, moderate, without behavioral disturbance, psychotic disturbance, mood disturbance, and anxiety: Secondary | ICD-10-CM

## 2022-03-14 MED ORDER — MEMANTINE HCL 10 MG PO TABS: 10.0000 mg | ORAL_TABLET | Freq: Every evening | ORAL | 11 refills | Status: DC

## 2022-03-14 NOTE — Patient Instructions (Addendum)
It was a pleasure to see you today at our office.   Recommendations:  Meds: Follow up in 6 month Continue donepezil 10 mg daily Memantine 10 mg at night     RECOMMENDATIONS FOR ALL PATIENTS WITH MEMORY PROBLEMS: 1. Continue to exercise (Recommend 30 minutes of walking everyday, or 3 hours every week) 2. Increase social interactions - continue going to [place] and enjoy social gatherings with friends and family 3. Eat healthy, avoid fried foods and eat more fruits and vegetables 4. Maintain adequate blood pressure, blood sugar, and blood cholesterol level. Reducing the risk of stroke and cardiovascular disease also helps promoting better memory. 5. Avoid stressful situations. Live a simple life and avoid aggravations. Organize your time and prepare for the next day in anticipation. 6. Sleep well, avoid any interruptions of sleep and avoid any distractions in the bedroom that may interfere with adequate sleep quality 7. Avoid sugar, avoid sweets as there is a strong link between excessive sugar intake, diabetes, and cognitive impairment We discussed the Mediterranean diet, which has been shown to help patients reduce the risk of progressive memory disorders and reduces cardiovascular risk. This includes eating fish, eat fruits and green leafy vegetables, nuts like almonds and hazelnuts, walnuts, and also use olive oil. Avoid fast foods and fried foods as much as possible. Avoid sweets and sugar as sugar use has been linked to worsening of memory function.  There is always a concern of gradual progression of memory problems. If this is the case, then we may need to adjust level of care according to patient needs. Support, both to the patient and caregiver, should then be put into place.       FALL PRECAUTIONS: Be cautious when walking. Scan the area for obstacles that may increase the risk of trips and falls. When getting up in the mornings, sit up at the edge of the bed for a few minutes before  getting out of bed. Consider elevating the bed at the head end to avoid drop of blood pressure when getting up. Walk always in a well-lit room (use night lights in the walls). Avoid area rugs or power cords from appliances in the middle of the walkways. Use a walker or a cane if necessary and consider physical therapy for balance exercise. Get your eyesight checked regularly.  FINANCIAL OVERSIGHT: Supervision, especially oversight when making financial decisions or transactions is also recommended.  HOME SAFETY: Consider the safety of the kitchen when operating appliances like stoves, microwave oven, and blender. Consider having supervision and share cooking responsibilities until no longer able to participate in those. Accidents with firearms and other hazards in the house should be identified and addressed as well.   ABILITY TO BE LEFT ALONE: If patient is unable to contact 911 operator, consider using LifeLine, or when the need is there, arrange for someone to stay with patients. Smoking is a fire hazard, consider supervision or cessation. Risk of wandering should be assessed by caregiver and if detected at any point, supervision and safe proof recommendations should be instituted.  MEDICATION SUPERVISION: Inability to self-administer medication needs to be constantly addressed. Implement a mechanism to ensure safe administration of the medications.   DRIVING: Regarding driving, in patients with progressive memory problems, driving will be impaired. We advise to have someone else do the driving if trouble finding directions or if minor accidents are reported. Independent driving assessment is available to determine safety of driving.   If you are interested in the driving assessment,  you can contact the following:  [employer] in [place]  [place] [phone] or  [phone]      [place] refers to food and lifestyle choices that are based on the traditions of countries located on the [employer]. This way of eating has been shown to help prevent certain conditions and improve outcomes for people who have chronic diseases, like kidney disease and heart disease. What are tips for following this plan? Lifestyle  Cook and eat meals together with your family, when possible. Drink enough fluid to keep your urine clear or pale yellow. Be physically active every day. This includes: Aerobic exercise like running or swimming. Leisure activities like gardening, walking, or housework. Get 7-8 hours of sleep each night. If recommended by your health care provider, drink red wine in moderation. This means 1 glass a day for nonpregnant women and 2 glasses a day for men. A glass of wine equals 5 oz (150 mL). Reading food labels  Check the serving size of packaged foods. For foods such as rice and pasta, the serving size refers to the amount of cooked product, not dry. Check the total fat in packaged foods. Avoid foods that have saturated fat or trans fats. Check the ingredients list for added sugars, such as corn syrup. Shopping  At the grocery store, buy most of your food from the areas near the walls of the store. This includes: Fresh fruits and vegetables (produce). Grains, beans, nuts, and seeds. Some of these may be available in unpackaged forms or large amounts (in bulk). Fresh seafood. Poultry and eggs. Low-fat dairy products. Buy whole ingredients instead of prepackaged foods. Buy fresh fruits and vegetables in-season from local farmers markets. Buy frozen fruits and vegetables in resealable bags. If you do not have access to quality fresh seafood, buy precooked frozen shrimp or canned fish, such as tuna, salmon, or sardines. Buy small amounts of raw or cooked vegetables, salads, or olives from the deli or salad  bar at your store. Stock your pantry so you always have certain foods on hand, such as olive oil, canned tuna, canned tomatoes, rice, pasta, and beans. Cooking  Cook foods with extra-virgin olive oil instead of using butter or other vegetable oils. Have meat as a side dish, and have vegetables or grains as your main dish. This means having meat in small portions or adding small amounts of meat to foods like pasta or stew. Use beans or vegetables instead of meat in common dishes like chili or lasagna. Experiment with different cooking methods. Try roasting or broiling vegetables instead of steaming or sauteing them. Add frozen vegetables to soups, stews, pasta, or rice. Add nuts or seeds for added healthy fat at each meal. You can add these to yogurt, salads, or vegetable dishes. Marinate fish or vegetables using olive oil, lemon juice, garlic, and fresh herbs. Meal planning  Plan to eat 1 vegetarian meal one day each week. Try to work up to 2 vegetarian meals, if possible. Eat seafood 2 or more times a week. Have healthy snacks readily available, such as: Vegetable sticks with hummus. Greek yogurt. Fruit and nut trail mix. Eat balanced meals throughout the week. This includes: Fruit: 2-3 servings a day Vegetables: 4-5 servings a day Low-fat dairy: 2 servings a day Fish, poultry, or lean meat: 1 serving a day Beans and legumes: 2 or more servings a week Nuts and seeds:  1-2 servings a day Whole grains: 6-8 servings a day Extra-virgin olive oil: 3-4 servings a day Limit red meat and sweets to only a few servings a month What are my food choices? Mediterranean diet Recommended Grains: Whole-grain pasta. Brown rice. Bulgar wheat. Polenta. Couscous. Whole-wheat bread. [place]. Vegetables: Artichokes. Beets. Broccoli. Cabbage. Carrots. Eggplant. Green beans. Chard. Kale. Spinach. Onions. Leeks. Peas. Squash. Tomatoes. Peppers. Radishes. Fruits: Apples. Apricots. Avocado. Berries.  Bananas. Cherries. Dates. Figs. Grapes. Lemons. Melon. Oranges. Peaches. Plums. Pomegranate. Meats and other protein foods: Beans. Almonds. Sunflower seeds. Pine nuts. Peanuts. [place]. Salmon. Scallops. Shrimp. [place]. Tilapia. Clams. Oysters. Eggs. Dairy: Low-fat milk. Cheese. Greek yogurt. Beverages: Water. Red wine. Herbal tea. Fats and oils: Extra virgin olive oil. Avocado oil. Grape seed oil. Sweets and desserts: [place] yogurt with honey. Baked apples. Poached pears. Trail mix. Seasoning and other foods: Basil. Cilantro. Coriander. Cumin. Mint. Parsley. Sage. Rosemary. Tarragon. Garlic. Oregano. Thyme. Pepper. Balsalmic vinegar. Tahini. Hummus. Tomato sauce. Olives. Mushrooms. Limit these Grains: Prepackaged pasta or rice dishes. Prepackaged cereal with added sugar. Vegetables: Deep fried potatoes (french fries). Fruits: Fruit canned in syrup. Meats and other protein foods: Beef. Pork. Lamb. Poultry with skin. Hot dogs. [place] Salines. Dairy: Ice cream. Sour cream. Whole milk. Beverages: Juice. Sugar-sweetened soft drinks. Beer. Liquor and spirits. Fats and oils: Butter. Canola oil. Vegetable oil. Beef fat (tallow). Lard. Sweets and desserts: Cookies. Cakes. Pies. Candy. Seasoning and other foods: Mayonnaise. Premade sauces and marinades. The items listed may not be a complete list. Talk with your dietitian about what dietary choices are right for you. Summary The Mediterranean diet includes both food and lifestyle choices. Eat a variety of fresh fruits and vegetables, beans, nuts, seeds, and whole grains. Limit the amount of red meat and sweets that you eat. Talk with your health care provider about whether it is safe for you to drink red wine in moderation. This means 1 glass a day for nonpregnant women and 2 glasses a day for men. A glass of wine equals 5 oz (150 mL). This information is not intended to replace advice given to you by your health care provider. Make sure you discuss any questions you  have with your health care provider. Document Released: 01/12/2016 Document Revised: 02/14/2016 Document Reviewed: 01/12/2016 Elsevier Interactive Patient Education  2017 [place] provider has requested that you have labwork completed today. Please go to [facility] ([id number]) on the second floor of this building before leaving the office today. You do not need to check in. If you are not called within 15 minutes please check with the front desk.   We have sent a referral to [place] for your Lumbar Puncture[facility] and they will call you directly to schedule your appointment. They are located at [place]. If you need to contact them directly please call [phone].

## 2022-03-24 DIAGNOSIS — I10 Essential (primary) hypertension: Secondary | ICD-10-CM | POA: Insufficient documentation

## 2022-03-24 NOTE — Progress Notes (Deleted)
Cardiology Office Note   Date:  03/24/2022   ID:  Gina King, Gina King 1945-08-17, MRN [id number]  PCP:  Hoyt Koch, MD  Cardiologist:   Minus Breeding, MD   No chief complaint on file.     History of Present Illness: Gina King is a 76 y.o. female who presents for evaluation of strokes.  She was referred by  Hoyt Koch, * she has been seen by neurology because of dementia and has had an extensive work-up when she is found to have microvascular ischemic changes superimposed on small chronic infarcts.  She was  sent here to evaluate possible ischemic strokes.   She wore a 14 day monitor and there was no evidence of atrial fib.  ***   ***  ****The patient's had no past cardiac history.  I do see that her blood pressure has been elevated 3 times in a row at recent visits.  She has some dyslipidemia and was started on statin in February.  However, she has not had any cardiac work-up.  She lives at home with her husband.  She is up and down stairs 12 times a day.  She does not describe chest pressure, neck or arm discomfort.  She does not have shortness of breath, PND or orthopnea.  She does not notice palpitations, presyncope or syncope.  However, her history is compromised by the fact that she has short-term memory disorder.   Past Medical History:  Diagnosis Date   CVA (cerebral vascular accident)    07/2021 MRI - Moderate chronic microvascular ischemic changes with a few superimposed chronic small vessel infarcts   GERD (gastroesophageal reflux disease) 12/30/2006   Mixed hyperlipidemia 07/28/2007   Moderate dementia, unclear etiology 08/25/2021   Numbness and tingling of left leg 11/30/2020   Osteopenia 12/30/2006   Restless leg syndrome 06/23/2021   Vitamin D deficiency 07/28/2007    Past Surgical History:  Procedure Laterality Date   ABDOMINAL HYSTERECTOMY  1984     Current Outpatient Medications  Medication Sig Dispense Refill   cholecalciferol  (VITAMIN D) 1000 UNITS tablet Take 1,000 Units by mouth daily.     donepezil (ARICEPT) 10 MG tablet Take 5 mg by mouth at bedtime.     memantine (NAMENDA) 10 MG tablet Take 1 tablet (10 mg total) by mouth at bedtime. 30 tablet 11   Multiple Vitamin (MULTIVITAMIN) tablet Take 1 tablet by mouth daily.     pantoprazole (PROTONIX) 20 MG tablet TAKE 1 TABLET EVERY DAY 90 tablet 0   rosuvastatin (CRESTOR) 20 MG tablet Take 1 tablet (20 mg total) by mouth daily. 90 tablet 3   vitamin B-12 (CYANOCOBALAMIN) 1000 MCG tablet Take 1,000 mcg by mouth daily. Reported on 11/07/2015     vitamin C (ASCORBIC ACID) 500 MG tablet Take 500 mg by mouth 2 (two) times daily.     No current facility-administered medications for this visit.    Allergies:   Gabapentin    ROS:  Please see the history of present illness.   Otherwise, review of systems are positive for ***.   All other systems are reviewed and negative.    PHYSICAL EXAM: VS:  There were no vitals taken for this visit. , BMI There is no height or weight on file to calculate BMI. GENERAL:  Well appearing NECK:  No jugular venous distention, waveform within normal limits, carotid upstroke brisk and symmetric, no bruits, no thyromegaly LUNGS:  Clear to auscultation bilaterally CHEST:  Unremarkable  HEART:  PMI not displaced or sustained,S1 and S2 within normal limits, no S3, no S4, no clicks, no rubs, *** murmurs ABD:  Flat, positive bowel sounds normal in frequency in pitch, no bruits, no rebound, no guarding, no midline pulsatile mass, no hepatomegaly, no splenomegaly EXT:  2 plus pulses throughout, no edema, no cyanosis no clubbing    ***GENERAL:  Well appearing HEENT:  Pupils equal round and reactive, fundi not visualized, oral mucosa unremarkable NECK:  No jugular venous distention, waveform within normal limits, carotid upstroke brisk and symmetric, no bruits, no thyromegaly LYMPHATICS:  No cervical, inguinal adenopathy LUNGS:  Clear to  auscultation bilaterally BACK:  No CVA tenderness CHEST:  Unremarkable HEART:  PMI not displaced or sustained,S1 and S2 within normal limits, no S3, no S4, no clicks, no rubs, no murmurs ABD:  Flat, positive bowel sounds normal in frequency in pitch, no bruits, no rebound, no guarding, no midline pulsatile mass, no hepatomegaly, no splenomegaly EXT:  2 plus pulses throughout, no edema, no cyanosis no clubbing SKIN:  No rashes no nodules NEURO:  Cranial nerves II through XII grossly intact, motor grossly intact throughout PSYCH:  Cognitively intact, oriented to person place and time    EKG:  EKG is *** ordered today. The ekg ordered today demonstrates sinus rhythm, rate ***, axis within normal limits, intervals within normal limits, no acute ST-T wave changes.   Recent Labs: 09/12/2021: ALT 12; BUN 13; Creatinine, Ser 0.80; Hemoglobin 14.6; Platelets 235.0; Potassium 4.2; Sodium 141    Lipid Panel    Component Value Date/Time   CHOL 238 (H) 08/29/2020 1335   TRIG 169.0 (H) 08/29/2020 1335   HDL 60.50 08/29/2020 1335   CHOLHDL 4 08/29/2020 1335   VLDL 33.8 08/29/2020 1335   LDLCALC 143 (H) 08/29/2020 1335   LDLDIRECT 108.0 11/07/2015 1350      Wt Readings from Last 3 Encounters:  03/14/22 121 lb (54.9 kg)  11/21/21 118 lb 9.6 oz (53.8 kg)  09/27/21 114 lb (51.7 kg)      Other studies Reviewed: Additional studies/ records that were reviewed today include:  ***. Review of the above records demonstrates:  Please see elsewhere in the note.     ASSESSMENT AND PLAN:  CVA:  ***   There is a question of ischemic infarcts.  This does not look like embolic source but I will chec a monitor to make sure there were no arrhythmias.  When we see her back I might consider an echocardiogram although I suspect a structurally normal heart from her exam and absence of symptoms.  No change in therapy at this point.  I will defer to neurology whether they think aspirin is indicated  HTN: Her  blood pressure is *** mildly elevated.  Her husband is going to bring back blood pressure cuff so we can get these working so that they can keep a home diary and she might need med titration.  Dyslipidemia:   ***  She was started on a statin in February and I will have her come back for lipid profile.  I think the goal LDL should be at least less than 100.   Current medicines are reviewed at length with the patient today.  The patient does not have concerns regarding medicines.  The following changes have been made:  ***  Labs/ tests ordered today include: ***  No orders of the defined types were placed in this encounter.    Disposition:   FU with APP in ***  months.     Signed, Minus Breeding, MD  03/24/2022 8:25 PM    [facility]

## 2022-03-27 ENCOUNTER — Ambulatory Visit: Payer: Medicare HMO | Attending: Cardiology | Admitting: Cardiology

## 2022-03-27 DIAGNOSIS — E785 Hyperlipidemia, unspecified: Secondary | ICD-10-CM

## 2022-03-27 DIAGNOSIS — Z8673 Personal history of transient ischemic attack (TIA), and cerebral infarction without residual deficits: Secondary | ICD-10-CM

## 2022-03-27 DIAGNOSIS — I10 Essential (primary) hypertension: Secondary | ICD-10-CM

## 2022-04-01 ENCOUNTER — Other Ambulatory Visit: Payer: Self-pay | Admitting: Internal Medicine

## 2022-06-10 DIAGNOSIS — R519 Headache, unspecified: Secondary | ICD-10-CM | POA: Diagnosis not present

## 2022-06-10 DIAGNOSIS — Z20822 Contact with and (suspected) exposure to covid-19: Secondary | ICD-10-CM | POA: Diagnosis not present

## 2022-06-11 DIAGNOSIS — Z20822 Contact with and (suspected) exposure to covid-19: Secondary | ICD-10-CM | POA: Diagnosis not present

## 2022-06-11 DIAGNOSIS — R519 Headache, unspecified: Secondary | ICD-10-CM | POA: Diagnosis not present

## 2022-06-25 ENCOUNTER — Encounter: Payer: Self-pay | Admitting: Internal Medicine

## 2022-06-25 NOTE — Progress Notes (Signed)
Subjective:    Patient ID: Gina King, female    DOB: 05/28/1946, 77 y.o.   MRN: [id number]      HPI [name] is here for  Chief Complaint  Patient presents with   Dizziness    Started last week , was dizzy yesterday a couple times usually in the mornings when getting out of bed   She is here with her husband who helps supplement her history  Dizzy - started Wednesday of last week, 6 days ago.  The dizziness was a spinning sensation.  First started when getting out of bed.  Dizziness lasted a while, but not long.  She got dizziness again when she went downstairs.  She had 1 other episode yesterday that was brief.  She has not had any dizziness since then.  She has not had dizziness prior to this.  She denies cold symptoms, numbness, tingling, headaches, changes in vision and nausea.  She is unsure if each of these events occurred with changes in position or movement.   No chagnes in medications.  Does drinks a lot of water daily.    Medications and allergies reviewed with patient and updated if appropriate.  Current Outpatient Medications on File Prior to Visit  Medication Sig Dispense Refill   cholecalciferol (VITAMIN D) 1000 UNITS tablet Take 1,000 Units by mouth daily.     donepezil (ARICEPT) 10 MG tablet Take 5 mg by mouth at bedtime.     memantine (NAMENDA) 10 MG tablet Take 1 tablet (10 mg total) by mouth at bedtime. 30 tablet 11   Multiple Vitamin (MULTIVITAMIN) tablet Take 1 tablet by mouth daily.     pantoprazole (PROTONIX) 20 MG tablet TAKE 1 TABLET EVERY DAY 90 tablet 0   rosuvastatin (CRESTOR) 20 MG tablet Take 1 tablet (20 mg total) by mouth daily. 90 tablet 3   vitamin B-12 (CYANOCOBALAMIN) 1000 MCG tablet Take 1,000 mcg by mouth daily. Reported on 11/07/2015     vitamin C (ASCORBIC ACID) 500 MG tablet Take 500 mg by mouth 2 (two) times daily.     No current facility-administered medications on file prior to visit.    Review of Systems  Constitutional:   Negative for fever.  HENT:  Negative for congestion, sinus pressure, sinus pain and sore throat.   Eyes:  Negative for visual disturbance.  Respiratory:  Negative for cough, shortness of breath and wheezing.   Cardiovascular:  Negative for chest pain and palpitations.  Gastrointestinal:  Negative for nausea.  Genitourinary:  Negative for dysuria and frequency.  Musculoskeletal:  Negative for neck pain.  Neurological:  Positive for dizziness and headaches (occ). Negative for weakness and numbness.       Objective:   Vitals:   06/26/22 1144  BP: 124/78  Pulse: 68  Temp: 98.5 F (36.9 C)  SpO2: 99%   BP Readings from Last 3 Encounters:  06/26/22 124/78  03/14/22 (!) 150/72  11/21/21 120/78   Wt Readings from Last 3 Encounters:  06/26/22 116 lb (52.6 kg)  03/14/22 121 lb (54.9 kg)  11/21/21 118 lb 9.6 oz (53.8 kg)   Body mass index is 20.55 kg/m.    Physical Exam Constitutional:      General: She is not in acute distress.    Appearance: Normal appearance. She is not ill-appearing.  HENT:     Head: Normocephalic and atraumatic.     Right Ear: Tympanic membrane, ear canal and external ear normal.     Left Ear:  Tympanic membrane, ear canal and external ear normal.     Mouth/Throat:     Mouth: Mucous membranes are moist.     Pharynx: No oropharyngeal exudate or posterior oropharyngeal erythema.  Eyes:     Extraocular Movements: Extraocular movements intact.     Conjunctiva/sclera: Conjunctivae normal.  Cardiovascular:     Rate and Rhythm: Normal rate and regular rhythm.     Heart sounds: Normal heart sounds. No murmur heard. Pulmonary:     Effort: Pulmonary effort is normal. No respiratory distress.     Breath sounds: Normal breath sounds. No wheezing or rales.  Musculoskeletal:     Cervical back: Neck supple. No tenderness.     Right lower leg: No edema.     Left lower leg: No edema.  Lymphadenopathy:     Cervical: No cervical adenopathy.  Skin:    General:  Skin is warm and dry.     Findings: No rash.  Neurological:     Mental Status: She is alert.     Cranial Nerves: No cranial nerve deficit.     Sensory: No sensory deficit.     Motor: No weakness.     Gait: Gait normal.  Psychiatric:        Mood and Affect: Mood normal.        Behavior: Behavior normal.            Assessment & Plan:    Vertigo: Acute Has had 3 brief episodes of dizziness/spinning sensation-2 episodes 6 days ago and 1 episode yesterday No cold symptoms, neurological symptoms, changes in vision or nausea She drinks plenty of water and there is no evidence of other infection Exam is normal without neurological deficit Dizziness is likely secondary to inflammation or crystals in the middle ear-expect this to slowly improve Discussed with her and her husband stroke is less likely because of lack of other symptoms, normal exam and presentation which is classic for more of a middle ear issue No treatment necessary at this time No evaluation necessary-if symptoms continue or worsen they will call and we can evaluate further if needed with an MRI

## 2022-06-26 ENCOUNTER — Ambulatory Visit (INDEPENDENT_AMBULATORY_CARE_PROVIDER_SITE_OTHER): Payer: Medicare HMO | Admitting: Internal Medicine

## 2022-06-26 ENCOUNTER — Encounter: Payer: Self-pay | Admitting: Internal Medicine

## 2022-06-26 VITALS — BP 124/78 | HR 68 | Temp 98.5°F | Ht 63.0 in | Wt 116.0 lb

## 2022-06-26 DIAGNOSIS — R42 Dizziness and giddiness: Secondary | ICD-10-CM

## 2022-06-26 NOTE — Patient Instructions (Addendum)
      Your dizziness is most likely related to inflammation in the middle ear.      Medications changes include :   none     Return if symptoms worsen or fail to improve.

## 2022-06-29 DIAGNOSIS — Z20822 Contact with and (suspected) exposure to covid-19: Secondary | ICD-10-CM | POA: Diagnosis not present

## 2022-06-29 DIAGNOSIS — R519 Headache, unspecified: Secondary | ICD-10-CM | POA: Diagnosis not present

## 2022-06-30 DIAGNOSIS — R519 Headache, unspecified: Secondary | ICD-10-CM | POA: Diagnosis not present

## 2022-06-30 DIAGNOSIS — Z20822 Contact with and (suspected) exposure to covid-19: Secondary | ICD-10-CM | POA: Diagnosis not present

## 2022-07-01 DIAGNOSIS — R519 Headache, unspecified: Secondary | ICD-10-CM | POA: Diagnosis not present

## 2022-07-01 DIAGNOSIS — Z20822 Contact with and (suspected) exposure to covid-19: Secondary | ICD-10-CM | POA: Diagnosis not present

## 2022-07-03 DIAGNOSIS — Z20822 Contact with and (suspected) exposure to covid-19: Secondary | ICD-10-CM | POA: Diagnosis not present

## 2022-07-03 DIAGNOSIS — R519 Headache, unspecified: Secondary | ICD-10-CM | POA: Diagnosis not present

## 2022-07-04 DIAGNOSIS — R519 Headache, unspecified: Secondary | ICD-10-CM | POA: Diagnosis not present

## 2022-07-04 DIAGNOSIS — Z20822 Contact with and (suspected) exposure to covid-19: Secondary | ICD-10-CM | POA: Diagnosis not present

## 2022-07-07 DIAGNOSIS — R519 Headache, unspecified: Secondary | ICD-10-CM | POA: Diagnosis not present

## 2022-07-07 DIAGNOSIS — Z20822 Contact with and (suspected) exposure to covid-19: Secondary | ICD-10-CM | POA: Diagnosis not present

## 2022-07-12 DIAGNOSIS — R519 Headache, unspecified: Secondary | ICD-10-CM | POA: Diagnosis not present

## 2022-07-12 DIAGNOSIS — Z20822 Contact with and (suspected) exposure to covid-19: Secondary | ICD-10-CM | POA: Diagnosis not present

## 2022-07-14 DIAGNOSIS — Z20822 Contact with and (suspected) exposure to covid-19: Secondary | ICD-10-CM | POA: Diagnosis not present

## 2022-07-14 DIAGNOSIS — R519 Headache, unspecified: Secondary | ICD-10-CM | POA: Diagnosis not present

## 2022-07-23 ENCOUNTER — Other Ambulatory Visit: Payer: Self-pay | Admitting: Physician Assistant

## 2022-08-02 ENCOUNTER — Encounter: Payer: Self-pay | Admitting: Physician Assistant

## 2022-08-13 ENCOUNTER — Other Ambulatory Visit: Payer: Self-pay | Admitting: Internal Medicine

## 2022-08-14 ENCOUNTER — Other Ambulatory Visit: Payer: Self-pay

## 2022-08-16 ENCOUNTER — Other Ambulatory Visit: Payer: Self-pay

## 2022-08-30 ENCOUNTER — Telehealth: Payer: Self-pay

## 2022-08-30 MED ORDER — DONEPEZIL HCL 10 MG PO TABS: ORAL_TABLET | ORAL | 1 refills | Status: DC

## 2022-08-30 NOTE — Telephone Encounter (Signed)
MEDICATION:donepezil (ARICEPT) 10 MG tablet   PHARMACY: [facility]  Comments:   **Let patient know to contact pharmacy at the end of the day to make sure medication is ready. **  ** Please notify patient to allow 48-72 hours to process**  **Encourage patient to contact the pharmacy for refills or they can request refills through [facility]**

## 2022-08-30 NOTE — Telephone Encounter (Signed)
Refill sent to [facility]

## 2022-09-04 ENCOUNTER — Other Ambulatory Visit: Payer: Self-pay

## 2022-09-04 MED ORDER — DONEPEZIL HCL 10 MG PO TABS
ORAL_TABLET | ORAL | 1 refills | Status: DC
Start: 2022-09-04 — End: 2022-12-05

## 2022-09-13 ENCOUNTER — Ambulatory Visit: Payer: Medicare HMO | Admitting: Physician Assistant

## 2022-09-27 ENCOUNTER — Telehealth: Payer: Self-pay

## 2022-09-27 NOTE — Telephone Encounter (Signed)
Called patient to schedule Medicare Annual Wellness Visit (AWV). Unable to reach patient.  Last date of AWV: 09/24/21  Please schedule an appointment at any time On Annual Wellness Visit Schedule.    

## 2022-09-27 NOTE — Telephone Encounter (Signed)
ContactNinfa Lindenanet G King to schedule their annual wellness visit. Appointment made for 10/08/22.  Agnes Lawrence, CMA (AAMA)  [facility]- AWV Program [phone]

## 2022-10-01 ENCOUNTER — Ambulatory Visit: Payer: Medicare HMO | Admitting: Physician Assistant

## 2022-10-01 ENCOUNTER — Encounter: Payer: Self-pay | Admitting: Physician Assistant

## 2022-10-01 VITALS — BP 157/78 | HR 72 | Ht 63.0 in | Wt 117.0 lb

## 2022-10-01 DIAGNOSIS — F03B0 Unspecified dementia, moderate, without behavioral disturbance, psychotic disturbance, mood disturbance, and anxiety: Secondary | ICD-10-CM

## 2022-10-01 NOTE — Patient Instructions (Signed)
It was a pleasure to see you today at our office.   Recommendations:  Meds: Follow up in 6 month Continue donepezil 10 mg daily Memantine 10 mg at night     RECOMMENDATIONS FOR ALL PATIENTS WITH MEMORY PROBLEMS: 1. Continue to exercise (Recommend 30 minutes of walking everyday, or 3 hours every week) 2. Increase social interactions - continue going to Church and enjoy social gatherings with friends and family 3. Eat healthy, avoid fried foods and eat more fruits and vegetables 4. Maintain adequate blood pressure, blood sugar, and blood cholesterol level. Reducing the risk of stroke and cardiovascular disease also helps promoting better memory. 5. Avoid stressful situations. Live a simple life and avoid aggravations. Organize your time and prepare for the next day in anticipation. 6. Sleep well, avoid any interruptions of sleep and avoid any distractions in the bedroom that may interfere with adequate sleep quality 7. Avoid sugar, avoid sweets as there is a strong link between excessive sugar intake, diabetes, and cognitive impairment We discussed the Mediterranean diet, which has been shown to help patients reduce the risk of progressive memory disorders and reduces cardiovascular risk. This includes eating fish, eat fruits and green leafy vegetables, nuts like almonds and hazelnuts, walnuts, and also use olive oil. Avoid fast foods and fried foods as much as possible. Avoid sweets and sugar as sugar use has been linked to worsening of memory function.  There is always a concern of gradual progression of memory problems. If this is the case, then we may need to adjust level of care according to patient needs. Support, both to the patient and caregiver, should then be put into place.       FALL PRECAUTIONS: Be cautious when walking. Scan the area for obstacles that may increase the risk of trips and falls. When getting up in the mornings, sit up at the edge of the bed for a few minutes before  getting out of bed. Consider elevating the bed at the head end to avoid drop of blood pressure when getting up. Walk always in a well-lit room (use night lights in the walls). Avoid area rugs or power cords from appliances in the middle of the walkways. Use a walker or a cane if necessary and consider physical therapy for balance exercise. Get your eyesight checked regularly.  FINANCIAL OVERSIGHT: Supervision, especially oversight when making financial decisions or transactions is also recommended.  HOME SAFETY: Consider the safety of the kitchen when operating appliances like stoves, microwave oven, and blender. Consider having supervision and share cooking responsibilities until no longer able to participate in those. Accidents with firearms and other hazards in the house should be identified and addressed as well.   ABILITY TO BE LEFT ALONE: If patient is unable to contact 911 operator, consider using LifeLine, or when the need is there, arrange for someone to stay with patients. Smoking is a fire hazard, consider supervision or cessation. Risk of wandering should be assessed by caregiver and if detected at any point, supervision and safe proof recommendations should be instituted.  MEDICATION SUPERVISION: Inability to self-administer medication needs to be constantly addressed. Implement a mechanism to ensure safe administration of the medications.   DRIVING: Regarding driving, in patients with progressive memory problems, driving will be impaired. We advise to have someone else do the driving if trouble finding directions or if minor accidents are reported. Independent driving assessment is available to determine safety of driving.   If you are interested in the driving assessment,   you can contact the following:  [employer] in [place] [phone]  [facility] [phone]  [facility] [phone]  [facility] [phone] or  [phone]      Mediterranean Diet A Mediterranean diet refers to food and lifestyle choices that are based on the traditions of countries located on the [place] Sea. This way of eating has been shown to help prevent certain conditions and improve outcomes for people who have chronic diseases, like kidney disease and heart disease. What are tips for following this plan? Lifestyle  Cook and eat meals together with your family, when possible. Drink enough fluid to keep your urine clear or pale yellow. Be physically active every day. This includes: Aerobic exercise like running or swimming. Leisure activities like gardening, walking, or housework. Get 7-8 hours of sleep each night. If recommended by your health care provider, drink red wine in moderation. This means 1 glass a day for nonpregnant women and 2 glasses a day for men. A glass of wine equals 5 oz (150 mL). Reading food labels  Check the serving size of packaged foods. For foods such as rice and pasta, the serving size refers to the amount of cooked product, not dry. Check the total fat in packaged foods. Avoid foods that have saturated fat or trans fats. Check the ingredients list for added sugars, such as corn syrup. Shopping  At the grocery store, buy most of your food from the areas near the walls of the store. This includes: Fresh fruits and vegetables (produce). Grains, beans, nuts, and seeds. Some of these may be available in unpackaged forms or large amounts (in bulk). Fresh seafood. Poultry and eggs. Low-fat dairy products. Buy whole ingredients instead of prepackaged foods. Buy fresh fruits and vegetables in-season from local farmers markets. Buy frozen fruits and vegetables in resealable bags. If you do not have access to quality fresh seafood, buy precooked frozen shrimp or canned fish, such as tuna, salmon, or sardines. Buy small amounts of raw or cooked vegetables, salads, or olives from the deli or salad  bar at your store. Stock your pantry so you always have certain foods on hand, such as olive oil, canned tuna, canned tomatoes, rice, pasta, and beans. Cooking  Cook foods with extra-virgin olive oil instead of using butter or other vegetable oils. Have meat as a side dish, and have vegetables or grains as your main dish. This means having meat in small portions or adding small amounts of meat to foods like pasta or stew. Use beans or vegetables instead of meat in common dishes like chili or lasagna. Experiment with different cooking methods. Try roasting or broiling vegetables instead of steaming or sauteing them. Add frozen vegetables to soups, stews, pasta, or rice. Add nuts or seeds for added healthy fat at each meal. You can add these to yogurt, salads, or vegetable dishes. Marinate fish or vegetables using olive oil, lemon juice, garlic, and fresh herbs. Meal planning  Plan to eat 1 vegetarian meal one day each week. Try to work up to 2 vegetarian meals, if possible. Eat seafood 2 or more times a week. Have healthy snacks readily available, such as: Vegetable sticks with hummus. Greek yogurt. Fruit and nut trail mix. Eat balanced meals throughout the week. This includes: Fruit: 2-3 servings a day Vegetables: 4-5 servings a day Low-fat dairy: 2 servings a day Fish, poultry, or lean meat: 1 serving a day Beans and legumes: 2 or more servings a week Nuts and seeds:   1-2 servings a day Whole grains: 6-8 servings a day Extra-virgin olive oil: 3-4 servings a day Limit red meat and sweets to only a few servings a month What are my food choices? Mediterranean diet Recommended Grains: Whole-grain pasta. Brown rice. Bulgar wheat. Polenta. Couscous. Whole-wheat bread. Oatmeal. Quinoa. Vegetables: Artichokes. Beets. Broccoli. Cabbage. Carrots. Eggplant. Green beans. Chard. Kale. Spinach. Onions. Leeks. Peas. Squash. Tomatoes. Peppers. Radishes. Fruits: Apples. Apricots. Avocado. Berries.  Bananas. Cherries. Dates. Figs. Grapes. Lemons. Melon. Oranges. Peaches. Plums. Pomegranate. Meats and other protein foods: Beans. Almonds. Sunflower seeds. Pine nuts. Peanuts. Cod. Salmon. Scallops. Shrimp. Tuna. Tilapia. Clams. Oysters. Eggs. Dairy: Low-fat milk. Cheese. Greek yogurt. Beverages: Water. Red wine. Herbal tea. Fats and oils: Extra virgin olive oil. Avocado oil. Grape seed oil. Sweets and desserts: Greek yogurt with honey. Baked apples. Poached pears. Trail mix. Seasoning and other foods: Basil. Cilantro. Coriander. Cumin. Mint. Parsley. Sage. Rosemary. Tarragon. Garlic. Oregano. Thyme. Pepper. Balsalmic vinegar. Tahini. Hummus. Tomato sauce. Olives. Mushrooms. Limit these Grains: Prepackaged pasta or rice dishes. Prepackaged cereal with added sugar. Vegetables: Deep fried potatoes (french fries). Fruits: Fruit canned in syrup. Meats and other protein foods: Beef. Pork. Lamb. Poultry with skin. Hot dogs. Bacon. Dairy: Ice cream. Sour cream. Whole milk. Beverages: Juice. Sugar-sweetened soft drinks. Beer. Liquor and spirits. Fats and oils: Butter. Canola oil. Vegetable oil. Beef fat (tallow). Lard. Sweets and desserts: Cookies. Cakes. Pies. Candy. Seasoning and other foods: Mayonnaise. Premade sauces and marinades. The items listed may not be a complete list. Talk with your dietitian about what dietary choices are right for you. Summary The Mediterranean diet includes both food and lifestyle choices. Eat a variety of fresh fruits and vegetables, beans, nuts, seeds, and whole grains. Limit the amount of red meat and sweets that you eat. Talk with your health care provider about whether it is safe for you to drink red wine in moderation. This means 1 glass a day for nonpregnant women and 2 glasses a day for men. A glass of wine equals 5 oz (150 mL). This information is not intended to replace advice given to you by your health care provider. Make sure you discuss any questions you  have with your health care provider. Document Released: 01/12/2016 Document Revised: 02/14/2016 Document Reviewed: 01/12/2016 Elsevier Interactive Patient Education  2017 Elsevier Inc.  Your provider has requested that you have labwork completed today. Please go to  Endocrinology ([address]) on the second floor of this building before leaving the office today. You do not need to check in. If you are not called within 15 minutes please check with the front desk.   We have sent a referral to [facility] for your Lumbar PunctureI and they will call you directly to schedule your appointment. They are located at [address] If you need to contact them directly please call [phone].     

## 2022-10-01 NOTE — Progress Notes (Signed)
Assessment/Plan:   Dementia likely due to Alzheimer disease without behavioral disturbance   Gina King is a very pleasant 77 y.o. RH female seen today in follow up for memory loss. Patient is currently on donepezil 10 mg daily and memantine 10 mg nightly.  Prior MRI brain personally reviewed was remarkable for generalized parenchymal volume loss, moderate chronic microvascular ischemic changes likely with few superimposed chronic small vessel infarcts. MMSE was attempted, unable to perform. She is able to converse, and perform her ADLs.    Follow up in 6  months. Continue donepezil 10 mg daily and memantine 10 mg nightly, side effects discussed  Continue b12 supplements  Recommend good control of her cardiovascular risk factors Continue to control mood as per PCP    Subjective:    This patient is accompanied in the office by her husband who supplements the history.  Previous records as well as any outside records available were reviewed prior to todays visit. Patient was last seen on 03/14/22.    Any changes in memory since last visit?  As before, short-term memory may be worse in the late afternoon, and during the day, she does well.  Long-term memory is better than short-term memory.  She continues to read extensively.  She does not like to participate in activities outside of the house much. repeats oneself?  Endorsed Disoriented when walking into a room?  Patient denies except occasionally not remembering what patient came to the room for    Leaving objects in unusual places?    denies   Wandering behavior?  denies   Any personality changes since last visit?  denies   Any worsening depression?:  denies   Hallucinations or paranoia?  denies   Seizures?    denies    Any sleep changes?  Sleeps well . Denies vivid dreams, REM behavior or sleepwalking   Sleep apnea?   denies   Any hygiene concerns?    denies   Independent of bathing and dressing?  Endorsed  Does the patient  needs help with medications?  Husband is in charge   Who is in charge of the finances?  Husband is in charge     Any changes in appetite?  denies     Patient have trouble swallowing?  denies   Does the patient cook?  Any kitchen accidents such as leaving the stove on? Patient denies   Any headaches?   denies   Chronic back pain  denies   Ambulates with difficulty?     denies   Recent falls or head injuries? denies     Unilateral weakness, numbness or tingling?    denies   Any tremors?  denies   Any anosmia?  Patient denies   Any incontinence of urine?  denies   Any bowel dysfunction?     denies      Patient lives with her husband  Does the patient drive? No longer drives   Initial visit 4/11/2023[name] is a very pleasant 77 y.o. RH female  seen today in follow up for memory loss. This patient is accompanied in the office by her husband who supplements the history.  Previous records as well as any outside records available were reviewed prior to todays visit.  Patient underwent neurocognitive testing yielding a diagnosis of moderate dementia without behavioral disturbance.  She has been experiencing memory issues for the last 4 or 5 months.  Since that time, she the patient repeats herself, has begun to be  more argumentative "she hates to be wrong ".  She admits to mood changes, but denies hallucinations or paranoia.  Her husband and her sister-in-law are the main caretakers.  There are no hygiene concerns.  Her medications are in a pillbox, her husband monitors them.  He is also in charge of the finances at this time.  Her appetite is good, denies trouble swallowing.  She does not cook.  She enjoys doing work finding, crossword puzzles, and until 5 years ago, she was a Science writer trouble swallowing.  She does not cook.  She ambulates without difficulty, denies any falls or head injuries.  She continues to drive without getting lost.  She denies any headache, double vision, dizziness,  focal numbness or tingling, unilateral weakness, tremors or anosmia.  She has restless leg syndrome.  No history of seizures, denies urine incontinence, retention, constipation or diarrhea.     MRI of the brain 07/16/2021, personally reviewed, was remarkable for generalized parenchymal volume loss, moderate chronic microvascular ischemic changes likely with few superimposed chronic small vessel infarcts.     Dementia panel LP sent to [facility] on 09/18/2021, is positive for 14 3 protein elevated at 5725.  Other results consistent with Alzheimer's disease.    PREVIOUS MEDICATIONS:   CURRENT MEDICATIONS:  Outpatient Encounter Medications as of 10/01/2022  Medication Sig   cholecalciferol (VITAMIN D) 1000 UNITS tablet Take 1,000 Units by mouth daily.   donepezil (ARICEPT) 10 MG tablet TAKE 1/2 TABLET(5 MG) BY MOUTH DAILY FOR 2 WEEKS. INCREASE TO THE 1 TABLET AT 10 MG DAILY (Patient taking differently: 1 TABLET AT 10 MG DAILY)   memantine (NAMENDA) 10 MG tablet Take 1 tablet (10 mg total) by mouth at bedtime.   Multiple Vitamin (MULTIVITAMIN) tablet Take 1 tablet by mouth daily.   pantoprazole (PROTONIX) 20 MG tablet TAKE 1 TABLET EVERY DAY   rosuvastatin (CRESTOR) 20 MG tablet TAKE 1 TABLET EVERY DAY   vitamin B-12 (CYANOCOBALAMIN) 1000 MCG tablet Take 1,000 mcg by mouth daily. Reported on 11/07/2015   vitamin C (ASCORBIC ACID) 500 MG tablet Take 500 mg by mouth 2 (two) times daily.   No facility-administered encounter medications on file as of 10/01/2022.        No data to display             No data to display          Objective:     PHYSICAL EXAMINATION:    VITALS:   Vitals:   10/01/22 1422  BP: (!) 157/78  Pulse: 72  SpO2: 94%  Weight: 117 lb (53.1 kg)  Height: 5\' 3"  (1.6 m)    GEN:  The patient appears stated age and is in NAD. HEENT:  Normocephalic, atraumatic.   Neurological examination:  General: NAD, well-groomed, appears stated age. Orientation: The  patient is alert. Oriented to person, not to place or  date Cranial nerves: There is good facial symmetry.The speech is fluent and clear. No aphasia or dysarthria. Fund of knowledge is reduced. Recent and remote memory are impaired. Attention and concentration are reduced.  Able to name objects and unable to repeat phrases.  Hearing is intact to conversational tone.   Sensation: Sensation is intact to light touch throughout Motor: Strength is at least antigravity x4. DTR's 2/4 in UE/LE     Movement examination: Tone: There is normal tone in the UE/LE Abnormal movements:  no tremor.  No myoclonus.  No asterixis.   Coordination:  There is no  decremation with RAM's. Normal finger to nose  Gait and Station: The patient has no difficulty arising out of a deep-seated chair without the use of the hands. The patient's stride length is good.  Gait is cautious and narrow.    Thank you for allowing [place] the opportunity to participate in the care of this nice patient. Please do not hesitate to contact us for any questions or concerns.   Total time spent on today's visit was 22 minutes dedicated to this patient today, preparing to see patient, examining the patient, ordering tests and/or medications and counseling the patient, documenting clinical information in the EHR or other health record, independently interpreting results and communicating results to the patient/family, discussing treatment and goals, answering patient's questions and coordinating care.  Cc:  Myrlene Broker, MD  Marlowe Kays 10/01/2022 7:11 PM

## 2022-10-08 ENCOUNTER — Ambulatory Visit (INDEPENDENT_AMBULATORY_CARE_PROVIDER_SITE_OTHER): Payer: Medicare HMO

## 2022-10-08 VITALS — Ht 63.0 in | Wt 117.0 lb

## 2022-10-08 DIAGNOSIS — Z0000 Encounter for general adult medical examination without abnormal findings: Secondary | ICD-10-CM

## 2022-10-08 NOTE — Progress Notes (Signed)
I connected with  Gina King and husband on 10/08/22 by a audio enabled telemedicine application and verified that I am speaking with the correct person using two identifiers.  Patient Location: Home  Provider Location: Office/Clinic  I discussed the limitations of evaluation and management by telemedicine. The patient expressed understanding and agreed to proceed.  Patient Medicare AWV questionnaire was completed by the patient on 10/04/2022; I have confirmed that all information answered by patient is correct and no changes since this date.    Subjective:   Gina King is a 77 y.o. female who presents for Medicare Annual (Subsequent) preventive examination.  Review of Systems     Cardiac Risk Factors include: advanced age (>72men, >11 women);hypertension;sedentary lifestyle;dyslipidemia     Objective:    Today's Vitals   10/08/22 1118  Weight: 117 lb (53.1 kg)  Height: 5\' 3"  (1.6 m)  PainSc: 0-No pain   Body mass index is 20.73 kg/m.     10/08/2022   11:21 AM 10/01/2022    2:29 PM 03/14/2022    2:39 PM 09/12/2021    8:07 AM 09/07/2021   11:20 AM 10/31/2018   10:14 AM 10/29/2017   11:30 AM  Advanced Directives  Does Patient Have a Medical Advance Directive? No No Yes No No No No  Does patient want to make changes to medical advance directive?     No - Patient declined No - Patient declined   Would patient like information on creating a medical advance directive? No - Patient declined    No - Patient declined  Yes (ED - Information included in AVS)    Current Medications (verified) Outpatient Encounter Medications as of 10/08/2022  Medication Sig   cholecalciferol (VITAMIN D) 1000 UNITS tablet Take 1,000 Units by mouth daily.   donepezil (ARICEPT) 10 MG tablet TAKE 1/2 TABLET(5 MG) BY MOUTH DAILY FOR 2 WEEKS. INCREASE TO THE 1 TABLET AT 10 MG DAILY (Patient taking differently: 1 TABLET AT 10 MG DAILY)   memantine (NAMENDA) 10 MG tablet Take 1 tablet (10 mg total) by mouth at  bedtime.   Multiple Vitamin (MULTIVITAMIN) tablet Take 1 tablet by mouth daily.   pantoprazole (PROTONIX) 20 MG tablet TAKE 1 TABLET EVERY DAY   rosuvastatin (CRESTOR) 20 MG tablet TAKE 1 TABLET EVERY DAY   vitamin B-12 (CYANOCOBALAMIN) 1000 MCG tablet Take 1,000 mcg by mouth daily. Reported on 11/07/2015   vitamin C (ASCORBIC ACID) 500 MG tablet Take 500 mg by mouth 2 (two) times daily.   No facility-administered encounter medications on file as of 10/08/2022.    Allergies (verified) Gabapentin   History: Past Medical History:  Diagnosis Date   CVA (cerebral vascular accident)    07/2021 MRI - Moderate chronic microvascular ischemic changes with a few superimposed chronic small vessel infarcts   GERD (gastroesophageal reflux disease) 12/30/2006   Mixed hyperlipidemia 07/28/2007   Moderate dementia, unclear etiology 08/25/2021   Numbness and tingling of left leg 11/30/2020   Osteopenia 12/30/2006   Restless leg syndrome 06/23/2021   Vitamin D deficiency 07/28/2007   Past Surgical History:  Procedure Laterality Date   ABDOMINAL HYSTERECTOMY  1984   Family History  Problem Relation Age of Onset   Memory loss Father        with advanced age and other medical complications   Memory loss Brother        concerns for dementia presentation   Kidney failure Brother    Colon cancer Neg Hx  Social History   Socioeconomic History   Marital status: Married    Spouse name: Not on file   Number of children: 2   Years of education: 10   Highest education level: 10th grade  Occupational History   Occupation: retired    Associate Professor: Education administrator  Tobacco Use   Smoking status: Never   Smokeless tobacco: Never  Vaping Use   Vaping Use: Never used  Substance and Sexual Activity   Alcohol use: No   Drug use: No   Sexual activity: Not Currently  Other Topics Concern   Not on file  Social History Narrative   Right handed   Drinks caffeine   Two story home   Social Determinants  of Health   Financial Resource Strain: Medium Risk (10/08/2022)   Overall Financial Resource Strain (CARDIA)    Difficulty of Paying Living Expenses: Somewhat hard  Food Insecurity: No Food Insecurity (10/08/2022)   Hunger Vital Sign    Worried About Running Out of Food in the Last Year: Never true    Ran Out of Food in the Last Year: Never true  Transportation Needs: No Transportation Needs (10/08/2022)   PRAPARE - Administrator, Civil Service (Medical): No    Lack of Transportation (Non-Medical): No  Physical Activity: Insufficiently Active (10/08/2022)   Exercise Vital Sign    Days of Exercise per Week: 1 day    Minutes of Exercise per Session: 20 min  Stress: No Stress Concern Present (10/08/2022)   [employer] of Occupational Health - Occupational Stress Questionnaire    Feeling of Stress : Not at all  Social Connections: Unknown (10/08/2022)   Social Connection and Isolation Panel [NHANES]    Frequency of Communication with Friends and Family: Twice a week    Frequency of Social Gatherings with Friends and Family: Once a week    Attends Religious Services: Patient declined    Database administrator or Organizations: No    Attends Engineer, structural: Never    Marital Status: Married    Tobacco Counseling Counseling given: Not Answered   Clinical Intake:  Pre-visit preparation completed: Yes  Pain : No/denies pain Pain Score: 0-No pain     BMI - recorded: 20.73 Nutritional Risks: None Diabetes: No  How often do you need to have someone help you when you read instructions, pamphlets, or other written materials from your doctor or pharmacy?: 1 - Never What is the last grade level you completed in school?: HSG (patient could not remember)  Diabetic? No  Interpreter Needed?: No  Information entered by :: Susie Cassette, LPN.   Activities of Daily Living    10/08/2022   11:37 AM 10/04/2022   10:25 AM  In your present state of health, do you  have any difficulty performing the following activities:  Hearing? 0 0  Vision? 0   Difficulty concentrating or making decisions? 0 0  Walking or climbing stairs? 0 0  Dressing or bathing? 0 0  Doing errands, shopping? 0 0  Preparing Food and eating ? Y Y  Using the Toilet? N N  In the past six months, have you accidently leaked urine? N N  Do you have problems with loss of bowel control? N N  Managing your Medications? Y Y  Managing your Finances? Malvin Johns  Housekeeping or managing your Housekeeping? N N    Patient Care Team: Myrlene Broker, MD as PCP - General (Internal Medicine) Rollene Rotunda, MD as  PCP - Cardiology (Cardiology) Talmage Coin as Consulting Physician (Optometry) [facility] as Consulting Physician (Optometry)  Indicate any recent Medical Services you may have received from other than [facility] providers in the past year (date may be approximate).     Assessment:   This is a routine wellness examination for Humna.  Hearing/Vision screen Hearing Screening - Comments:: Denies hearing difficulties   Vision Screening - Comments:: No eye glasses at this time; [facility].  Dietary issues and exercise activities discussed: Current Exercise Habits: Home exercise routine, Type of exercise: walking, Time (Minutes): 20, Frequency (Times/Week): 1, Weekly Exercise (Minutes/Week): 20, Intensity: Moderate, Exercise limited by: Other - see comments;neurologic condition(s) (Restless Leg Syndrome)   Goals Addressed             This Visit's Progress    My goal for 2024 is to travel.        Depression Screen    10/08/2022   11:22 AM 06/26/2022   11:43 AM 09/07/2021   11:22 AM 06/23/2021    3:00 PM 08/29/2020    1:16 PM 10/31/2018   10:14 AM 10/29/2017   11:30 AM  PHQ 2/9 Scores  PHQ - 2 Score 0 0 0 0 0 0 1  PHQ- 9 Score 3      1    Fall Risk    10/08/2022   11:36 AM 10/04/2022   10:25 AM 10/01/2022    2:29 PM 06/26/2022   11:43 AM  03/14/2022    2:39 PM  Fall Risk   Falls in the past year? 0 0 0 0 0  Number falls in past yr: 0 0 0 0 0  Injury with Fall? 0 0 0 0 0  Risk for fall due to : No Fall Risks   No Fall Risks   Follow up Falls prevention discussed  Falls evaluation completed Falls evaluation completed Falls evaluation completed    FALL RISK PREVENTION PERTAINING TO THE HOME:  Any stairs in or around the home? Yes  If so, are there any without handrails? No  Home free of loose throw rugs in walkways, pet beds, electrical cords, etc? Yes  Adequate lighting in your home to reduce risk of falls? Yes   ASSISTIVE DEVICES UTILIZED TO PREVENT FALLS:  Life alert? No  Use of a cane, walker or w/c? No  Grab bars in the bathroom? Yes  Shower chair or bench in shower? No  Elevated toilet seat or a handicapped toilet? Yes   TIMED UP AND GO:  Was the test performed? No . Telephonic Visit  Cognitive Function:    10/08/2022   11:39 AM  MMSE - Mini Mental State Exam  Not completed: Unable to complete        09/07/2021   11:35 AM  6CIT Screen  What Year? 0 points  What month? 0 points  What time? 0 points  Count back from 20 0 points  Months in reverse 0 points  Repeat phrase 0 points  Total Score 0 points    Immunizations Immunization History  Administered Date(s) Administered   Fluad Quad(high Dose 65+) 02/16/2022   Influenza Split 03/04/2013   Influenza Whole 04/18/2010   Influenza, High Dose Seasonal PF 02/13/2016   Influenza, Seasonal, Injecte, Preservative Fre 03/04/2013   Influenza,inj,Quad PF,6+ Mos 02/22/2014   Influenza-Unspecified 03/18/2018, 02/25/2019, 03/04/2020, 03/04/2021   PFIZER(Purple Top)SARS-COV-2 Vaccination 07/31/2019, 08/26/2019, 03/15/2020, 09/02/2020   Pfizer Covid-19 Vaccine Bivalent Booster 60yrs & up  02/15/2021   Pneumococcal Conjugate-13 11/07/2015   Pneumococcal Polysaccharide-23 02/22/2014   Td 06/04/1997, 12/20/2008   Zoster Recombinat (Shingrix) 09/09/2020,  11/23/2020   Zoster, Live 11/07/2015    TDAP status: Due, Education has been provided regarding the importance of this vaccine. Advised may receive this vaccine at local pharmacy or Health Dept. Aware to provide a copy of the vaccination record if obtained from local pharmacy or Health Dept. Verbalized acceptance and understanding.  Flu Vaccine status: Up to date  Pneumococcal vaccine status: Up to date  Covid-19 vaccine status: Completed vaccines  Qualifies for Shingles Vaccine? Yes   Zostavax completed Yes   Shingrix Completed?: Yes  Screening Tests Health Maintenance  Topic Date Due   DTaP/Tdap/Td (3 - Tdap) 12/21/2018   COVID-19 Vaccine (6 - 2023-24 season) 02/02/2022   INFLUENZA VACCINE  01/03/2023   MAMMOGRAM  08/09/2023   Medicare Annual Wellness (AWV)  10/08/2023   COLONOSCOPY (Pts 45-88yrs Insurance coverage will need to be confirmed)  03/04/2024   Pneumonia Vaccine 79+ Years old  Completed   DEXA SCAN  Completed   Hepatitis C Screening  Completed   Zoster Vaccines- Shingrix  Completed   HPV VACCINES  Aged Out    Health Maintenance  Health Maintenance Due  Topic Date Due   DTaP/Tdap/Td (3 - Tdap) 12/21/2018   COVID-19 Vaccine (6 - 2023-24 season) 02/02/2022    Colorectal cancer screening: Type of screening: Colonoscopy. Completed 03/04/2014. Repeat every 10 years  Mammogram status: Completed 08/08/2021. Repeat every year  Bone Density status: Completed 09/05/2020. Results reflect: Bone density results: OSTEOPENIA. Repeat every 2-3 years.  Lung Cancer Screening: (Low Dose CT Chest recommended if Age 61-80 years, 30 pack-year currently smoking OR have quit w/in 15years.) does not qualify.   Lung Cancer Screening Referral: No  Additional Screening:  Hepatitis C Screening: does qualify; Completed 11/07/2015  Vision Screening: Recommended annual ophthalmology exams for early detection of glaucoma and other disorders of the eye. Is the patient up to date with their  annual eye exam?  Yes  Who is the provider or what is the name of the office in which the patient attends annual eye exams? Talmage Coin, OD at [facility] If pt is not established with a provider, would they like to be referred to a provider to establish care? No .   Dental Screening: Recommended annual dental exams for proper oral hygiene  Community Resource Referral / Chronic Care Management: CRR required this visit?  No   CCM required this visit?  No      Plan:     I have personally reviewed and noted the following in the patient's chart:   Medical and social history Use of alcohol, tobacco or illicit drugs  Current medications and supplements including opioid prescriptions. Patient is not currently taking opioid prescriptions. Functional ability and status Nutritional status Physical activity Advanced directives List of other physicians Hospitalizations, surgeries, and ER visits in previous 12 months Vitals Screenings to include cognitive, depression, and falls Referrals and appointments  In addition, I have reviewed and discussed with patient certain preventive protocols, quality metrics, and best practice recommendations. A written personalized care plan for preventive services as well as general preventive health recommendations were provided to patient.     Mickeal Needy, LPN   06/09/1094   Nurse Notes:  Patient has current diagnosis of cognitive impairment.  Patient is followed by neurology for ongoing assessment. Patient is unable to complete screening 6CIT or MMSE.

## 2022-10-08 NOTE — Patient Instructions (Addendum)
Gina King , Thank you for taking time to come for your Medicare Wellness Visit. I appreciate your ongoing commitment to your health goals. Please review the following plan we discussed and let me know if I can assist you in the future.   These are the goals we discussed:  Goals      My goal for 2024 is to travel.        This is a list of the screening recommended for you and due dates:  Health Maintenance  Topic Date Due   DTaP/Tdap/Td vaccine (3 - Tdap) 12/21/2018   COVID-19 Vaccine (6 - 2023-24 season) 02/02/2022   Flu Shot  01/03/2023   Mammogram  08/09/2023   Medicare Annual Wellness Visit  10/08/2023   Colon Cancer Screening  03/04/2024   Pneumonia Vaccine  Completed   DEXA scan (bone density measurement)  Completed   Hepatitis C Screening: USPSTF Recommendation to screen - Ages 51-79 yo.  Completed   Zoster (Shingles) Vaccine  Completed   HPV Vaccine  Aged Out    Advanced directives: No; Advance directive discussed with you today. Even though you declined this today please call our office should you change your mind and we can give you the proper paperwork for you to fill out.  Conditions/risks identified: Yes  Next appointment: Follow up in one year for your annual wellness visit.   Preventive Care 75 Years and Older, Female Preventive care refers to lifestyle choices and visits with your health care provider that can promote health and wellness. What does preventive care include? A yearly physical exam. This is also called an annual well check. Dental exams once or twice a year. Routine eye exams. Ask your health care provider how often you should have your eyes checked. Personal lifestyle choices, including: Daily care of your teeth and gums. Regular physical activity. Eating a healthy diet. Avoiding tobacco and drug use. Limiting alcohol use. Practicing safe sex. Taking low-dose aspirin every day. Taking vitamin and mineral supplements as recommended by your  health care provider. What happens during an annual well check? The services and screenings done by your health care provider during your annual well check will depend on your age, overall health, lifestyle risk factors, and family history of disease. Counseling  Your health care provider may ask you questions about your: Alcohol use. Tobacco use. Drug use. Emotional well-being. Home and relationship well-being. Sexual activity. Eating habits. History of falls. Memory and ability to understand (cognition). Work and work Astronomer. Reproductive health. Screening  You may have the following tests or measurements: Height, weight, and BMI. Blood pressure. Lipid and cholesterol levels. These may be checked every 5 years, or more frequently if you are over 14 years old. Skin check. Lung cancer screening. You may have this screening every year starting at age 44 if you have a 30-pack-year history of smoking and currently smoke or have quit within the past 15 years. Fecal occult blood test (FOBT) of the stool. You may have this test every year starting at age 42. Flexible sigmoidoscopy or colonoscopy. You may have a sigmoidoscopy every 5 years or a colonoscopy every 10 years starting at age 75. Hepatitis C blood test. Hepatitis B blood test. Sexually transmitted disease (STD) testing. Diabetes screening. This is done by checking your blood sugar (glucose) after you have not eaten for a while (fasting). You may have this done every 1-3 years. Bone density scan. This is done to screen for osteoporosis. You may have this done  starting at age 65. Mammogram. This may be done every 1-2 years. Talk to your health care provider about how often you should have regular mammograms. Talk with your health care provider about your test results, treatment options, and if necessary, the need for more tests. Vaccines  Your health care provider may recommend certain vaccines, such as: Influenza vaccine.  This is recommended every year. Tetanus, diphtheria, and acellular pertussis (Tdap, Td) vaccine. You may need a Td booster every 10 years. Zoster vaccine. You may need this after age 63. Pneumococcal 13-valent conjugate (PCV13) vaccine. One dose is recommended after age 81. Pneumococcal polysaccharide (PPSV23) vaccine. One dose is recommended after age 53. Talk to your health care provider about which screenings and vaccines you need and how often you need them. This information is not intended to replace advice given to you by your health care provider. Make sure you discuss any questions you have with your health care provider. Document Released: 06/17/2015 Document Revised: 02/08/2016 Document Reviewed: 03/22/2015 Elsevier Interactive Patient Education  2017 [employer] Prevention in the Home Falls can cause injuries. They can happen to people of all ages. There are many things you can do to make your home safe and to help prevent falls. What can I do on the outside of my home? Regularly fix the edges of walkways and driveways and fix any cracks. Remove anything that might make you trip as you walk through a door, such as a raised step or threshold. Trim any bushes or trees on the path to your home. Use bright outdoor lighting. Clear any walking paths of anything that might make someone trip, such as rocks or tools. Regularly check to see if handrails are loose or broken. Make sure that both sides of any steps have handrails. Any raised decks and porches should have guardrails on the edges. Have any leaves, snow, or ice cleared regularly. Use sand or salt on walking paths during winter. Clean up any spills in your garage right away. This includes oil or grease spills. What can I do in the bathroom? Use night lights. Install grab bars by the toilet and in the tub and shower. Do not use towel bars as grab bars. Use non-skid mats or decals in the tub or shower. If you need to sit  down in the shower, use a plastic, non-slip stool. Keep the floor dry. Clean up any water that spills on the floor as soon as it happens. Remove soap buildup in the tub or shower regularly. Attach bath mats securely with double-sided non-slip rug tape. Do not have throw rugs and other things on the floor that can make you trip. What can I do in the bedroom? Use night lights. Make sure that you have a light by your bed that is easy to reach. Do not use any sheets or blankets that are too big for your bed. They should not hang down onto the floor. Have a firm chair that has side arms. You can use this for support while you get dressed. Do not have throw rugs and other things on the floor that can make you trip. What can I do in the kitchen? Clean up any spills right away. Avoid walking on wet floors. Keep items that you use a lot in easy-to-reach places. If you need to reach something above you, use a strong step stool that has a grab bar. Keep electrical cords out of the way. Do not use floor polish or wax that  makes floors slippery. If you must use wax, use non-skid floor wax. Do not have throw rugs and other things on the floor that can make you trip. What can I do with my stairs? Do not leave any items on the stairs. Make sure that there are handrails on both sides of the stairs and use them. Fix handrails that are broken or loose. Make sure that handrails are as long as the stairways. Check any carpeting to make sure that it is firmly attached to the stairs. Fix any carpet that is loose or worn. Avoid having throw rugs at the top or bottom of the stairs. If you do have throw rugs, attach them to the floor with carpet tape. Make sure that you have a light switch at the top of the stairs and the bottom of the stairs. If you do not have them, ask someone to add them for you. What else can I do to help prevent falls? Wear shoes that: Do not have high heels. Have rubber bottoms. Are  comfortable and fit you well. Are closed at the toe. Do not wear sandals. If you use a stepladder: Make sure that it is fully opened. Do not climb a closed stepladder. Make sure that both sides of the stepladder are locked into place. Ask someone to hold it for you, if possible. Clearly mark and make sure that you can see: Any grab bars or handrails. First and last steps. Where the edge of each step is. Use tools that help you move around (mobility aids) if they are needed. These include: Canes. Walkers. Scooters. Crutches. Turn on the lights when you go into a dark area. Replace any light bulbs as soon as they burn out. Set up your furniture so you have a clear path. Avoid moving your furniture around. If any of your floors are uneven, fix them. If there are any pets around you, be aware of where they are. Review your medicines with your doctor. Some medicines can make you feel dizzy. This can increase your chance of falling. Ask your doctor what other things that you can do to help prevent falls. This information is not intended to replace advice given to you by your health care provider. Make sure you discuss any questions you have with your health care provider. Document Released: 03/17/2009 Document Revised: 10/27/2015 Document Reviewed: 06/25/2014 Elsevier Interactive Patient Education  2017 [employer].

## 2022-12-05 ENCOUNTER — Other Ambulatory Visit: Payer: Self-pay | Admitting: Physician Assistant

## 2023-01-02 ENCOUNTER — Other Ambulatory Visit: Payer: Self-pay | Admitting: Physician Assistant

## 2023-04-02 ENCOUNTER — Ambulatory Visit: Payer: Medicare HMO | Admitting: Physician Assistant

## 2023-04-02 ENCOUNTER — Encounter: Payer: Self-pay | Admitting: Physician Assistant

## 2023-04-02 VITALS — BP 140/68 | HR 76 | Resp 18 | Ht 63.0 in | Wt 120.0 lb

## 2023-04-02 DIAGNOSIS — F03B0 Unspecified dementia, moderate, without behavioral disturbance, psychotic disturbance, mood disturbance, and anxiety: Secondary | ICD-10-CM | POA: Diagnosis not present

## 2023-04-02 MED ORDER — DONEPEZIL HCL 10 MG PO TABS: ORAL_TABLET | ORAL | 3 refills | Status: DC

## 2023-04-02 MED ORDER — MEMANTINE HCL 10 MG PO TABS: 10.0000 mg | ORAL_TABLET | Freq: Every day | ORAL | 3 refills | Status: DC

## 2023-04-02 NOTE — Patient Instructions (Signed)
It was a pleasure to see you today at our office.   Recommendations:  Meds: Follow up in 6 month Continue donepezil 10 mg daily Memantine 10 mg at night     RECOMMENDATIONS FOR ALL PATIENTS WITH MEMORY PROBLEMS: 1. Continue to exercise (Recommend 30 minutes of walking everyday, or 3 hours every week) 2. Increase social interactions - continue going to Church and enjoy social gatherings with friends and family 3. Eat healthy, avoid fried foods and eat more fruits and vegetables 4. Maintain adequate blood pressure, blood sugar, and blood cholesterol level. Reducing the risk of stroke and cardiovascular disease also helps promoting better memory. 5. Avoid stressful situations. Live a simple life and avoid aggravations. Organize your time and prepare for the next day in anticipation. 6. Sleep well, avoid any interruptions of sleep and avoid any distractions in the bedroom that may interfere with adequate sleep quality 7. Avoid sugar, avoid sweets as there is a strong link between excessive sugar intake, diabetes, and cognitive impairment We discussed the Mediterranean diet, which has been shown to help patients reduce the risk of progressive memory disorders and reduces cardiovascular risk. This includes eating fish, eat fruits and green leafy vegetables, nuts like almonds and hazelnuts, walnuts, and also use olive oil. Avoid fast foods and fried foods as much as possible. Avoid sweets and sugar as sugar use has been linked to worsening of memory function.  There is always a concern of gradual progression of memory problems. If this is the case, then we may need to adjust level of care according to patient needs. Support, both to the patient and caregiver, should then be put into place.       FALL PRECAUTIONS: Be cautious when walking. Scan the area for obstacles that may increase the risk of trips and falls. When getting up in the mornings, sit up at the edge of the bed for a few minutes before  getting out of bed. Consider elevating the bed at the head end to avoid drop of blood pressure when getting up. Walk always in a well-lit room (use night lights in the walls). Avoid area rugs or power cords from appliances in the middle of the walkways. Use a walker or a cane if necessary and consider physical therapy for balance exercise. Get your eyesight checked regularly.  FINANCIAL OVERSIGHT: Supervision, especially oversight when making financial decisions or transactions is also recommended.  HOME SAFETY: Consider the safety of the kitchen when operating appliances like stoves, microwave oven, and blender. Consider having supervision and share cooking responsibilities until no longer able to participate in those. Accidents with firearms and other hazards in the house should be identified and addressed as well.   ABILITY TO BE LEFT ALONE: If patient is unable to contact 911 operator, consider using LifeLine, or when the need is there, arrange for someone to stay with patients. Smoking is a fire hazard, consider supervision or cessation. Risk of wandering should be assessed by caregiver and if detected at any point, supervision and safe proof recommendations should be instituted.  MEDICATION SUPERVISION: Inability to self-administer medication needs to be constantly addressed. Implement a mechanism to ensure safe administration of the medications.   DRIVING: Regarding driving, in patients with progressive memory problems, driving will be impaired. We advise to have someone else do the driving if trouble finding directions or if minor accidents are reported. Independent driving assessment is available to determine safety of driving.   If you are interested in the driving assessment,   you can contact the following:  [employer] in [place] [phone]  [facility] [phone]  [facility] [phone]  [facility] [phone] or  [phone]      Mediterranean Diet A Mediterranean diet refers to food and lifestyle choices that are based on the traditions of countries located on the [place] Sea. This way of eating has been shown to help prevent certain conditions and improve outcomes for people who have chronic diseases, like kidney disease and heart disease. What are tips for following this plan? Lifestyle  Cook and eat meals together with your family, when possible. Drink enough fluid to keep your urine clear or pale yellow. Be physically active every day. This includes: Aerobic exercise like running or swimming. Leisure activities like gardening, walking, or housework. Get 7-8 hours of sleep each night. If recommended by your health care provider, drink red wine in moderation. This means 1 glass a day for nonpregnant women and 2 glasses a day for men. A glass of wine equals 5 oz (150 mL). Reading food labels  Check the serving size of packaged foods. For foods such as rice and pasta, the serving size refers to the amount of cooked product, not dry. Check the total fat in packaged foods. Avoid foods that have saturated fat or trans fats. Check the ingredients list for added sugars, such as corn syrup. Shopping  At the grocery store, buy most of your food from the areas near the walls of the store. This includes: Fresh fruits and vegetables (produce). Grains, beans, nuts, and seeds. Some of these may be available in unpackaged forms or large amounts (in bulk). Fresh seafood. Poultry and eggs. Low-fat dairy products. Buy whole ingredients instead of prepackaged foods. Buy fresh fruits and vegetables in-season from local farmers markets. Buy frozen fruits and vegetables in resealable bags. If you do not have access to quality fresh seafood, buy precooked frozen shrimp or canned fish, such as tuna, salmon, or sardines. Buy small amounts of raw or cooked vegetables, salads, or olives from the deli or salad  bar at your store. Stock your pantry so you always have certain foods on hand, such as olive oil, canned tuna, canned tomatoes, rice, pasta, and beans. Cooking  Cook foods with extra-virgin olive oil instead of using butter or other vegetable oils. Have meat as a side dish, and have vegetables or grains as your main dish. This means having meat in small portions or adding small amounts of meat to foods like pasta or stew. Use beans or vegetables instead of meat in common dishes like chili or lasagna. Experiment with different cooking methods. Try roasting or broiling vegetables instead of steaming or sauteing them. Add frozen vegetables to soups, stews, pasta, or rice. Add nuts or seeds for added healthy fat at each meal. You can add these to yogurt, salads, or vegetable dishes. Marinate fish or vegetables using olive oil, lemon juice, garlic, and fresh herbs. Meal planning  Plan to eat 1 vegetarian meal one day each week. Try to work up to 2 vegetarian meals, if possible. Eat seafood 2 or more times a week. Have healthy snacks readily available, such as: Vegetable sticks with hummus. Greek yogurt. Fruit and nut trail mix. Eat balanced meals throughout the week. This includes: Fruit: 2-3 servings a day Vegetables: 4-5 servings a day Low-fat dairy: 2 servings a day Fish, poultry, or lean meat: 1 serving a day Beans and legumes: 2 or more servings a week Nuts and seeds:   1-2 servings a day Whole grains: 6-8 servings a day Extra-virgin olive oil: 3-4 servings a day Limit red meat and sweets to only a few servings a month What are my food choices? Mediterranean diet Recommended Grains: Whole-grain pasta. Brown rice. Bulgar wheat. Polenta. Couscous. Whole-wheat bread. Oatmeal. Quinoa. Vegetables: Artichokes. Beets. Broccoli. Cabbage. Carrots. Eggplant. Green beans. Chard. Kale. Spinach. Onions. Leeks. Peas. Squash. Tomatoes. Peppers. Radishes. Fruits: Apples. Apricots. Avocado. Berries.  Bananas. Cherries. Dates. Figs. Grapes. Lemons. Melon. Oranges. Peaches. Plums. Pomegranate. Meats and other protein foods: Beans. Almonds. Sunflower seeds. Pine nuts. Peanuts. Cod. Salmon. Scallops. Shrimp. Tuna. Tilapia. Clams. Oysters. Eggs. Dairy: Low-fat milk. Cheese. Greek yogurt. Beverages: Water. Red wine. Herbal tea. Fats and oils: Extra virgin olive oil. Avocado oil. Grape seed oil. Sweets and desserts: Greek yogurt with honey. Baked apples. Poached pears. Trail mix. Seasoning and other foods: Basil. Cilantro. Coriander. Cumin. Mint. Parsley. Sage. Rosemary. Tarragon. Garlic. Oregano. Thyme. Pepper. Balsalmic vinegar. Tahini. Hummus. Tomato sauce. Olives. Mushrooms. Limit these Grains: Prepackaged pasta or rice dishes. Prepackaged cereal with added sugar. Vegetables: Deep fried potatoes (french fries). Fruits: Fruit canned in syrup. Meats and other protein foods: Beef. Pork. Lamb. Poultry with skin. Hot dogs. Bacon. Dairy: Ice cream. Sour cream. Whole milk. Beverages: Juice. Sugar-sweetened soft drinks. Beer. Liquor and spirits. Fats and oils: Butter. Canola oil. Vegetable oil. Beef fat (tallow). Lard. Sweets and desserts: Cookies. Cakes. Pies. Candy. Seasoning and other foods: Mayonnaise. Premade sauces and marinades. The items listed may not be a complete list. Talk with your dietitian about what dietary choices are right for you. Summary The Mediterranean diet includes both food and lifestyle choices. Eat a variety of fresh fruits and vegetables, beans, nuts, seeds, and whole grains. Limit the amount of red meat and sweets that you eat. Talk with your health care provider about whether it is safe for you to drink red wine in moderation. This means 1 glass a day for nonpregnant women and 2 glasses a day for men. A glass of wine equals 5 oz (150 mL). This information is not intended to replace advice given to you by your health care provider. Make sure you discuss any questions you  have with your health care provider. Document Released: 01/12/2016 Document Revised: 02/14/2016 Document Reviewed: 01/12/2016 Elsevier Interactive Patient Education  2017 Elsevier Inc.  Your provider has requested that you have labwork completed today. Please go to  Endocrinology ([address]) on the second floor of this building before leaving the office today. You do not need to check in. If you are not called within 15 minutes please check with the front desk.   We have sent a referral to [facility] for your Lumbar PunctureI and they will call you directly to schedule your appointment. They are located at [address] If you need to contact them directly please call [phone].     

## 2023-04-02 NOTE — Progress Notes (Addendum)
Assessment/Plan:   Dementia likely due to Alzheimer's disease without behavioral disturbance   Gina King is a very pleasant 77 y.o. RH female with a history of dementia likely due to Alzheimer's disease seen today in follow up for memory loss. Patient is currently on donepezil 10 mg daily and memantine 10 mg nightly.  Unable to perform MMSE as before. Memory is stable. Patient is able to participate on his ADLs without difficulties     Follow up in 6  months. Continue donepezil 10 mg daily and memantine 10 mg daily.  Side effects discussed  Recommend good control of her cardiovascular risk factors Continue to control mood as per PCP     Subjective:    This patient is accompanied in the office by her husband who supplements the history.  Previous records as well as any outside records available were reviewed prior to todays visit. Patient was last seen on 10/01/2022.    Any changes in memory since last visit? "  She has some good and bad days. Her memory is worse in the late afternoon as before ".  Long-term is better than short-term memory.  She continues to read.  She does not like to participate in activities outside of the house much.  She has developed some habits such as  chewing her nails and playing with a lose tooth. repeats oneself?  Endorsed Disoriented when walking into a room?  Patient denies   Leaving objects in unusual places?  May misplace things but not in unusual places   Wandering behavior?  Denies.   Any personality changes since last visit?  Denies. "Not as polite as she used to be"  Any worsening depression?:  Denies.   Hallucinations or paranoia?  Denies.   Seizures? denies    Any sleep changes? Sleeps well. Denies vivid dreams, REM behavior or sleepwalking   Sleep apnea?   Denies.   Any hygiene concerns? Denies.  Independent of bathing and dressing?  Endorsed  Does the patient needs help with medications?  Husband is in charge  Who is in charge of the  finances?  Husband is in charge    Any changes in appetite?  Denies.    Patient have trouble swallowing? Denies.   Does the patient cook? Not often, husband does most of it Any headaches?   Denies.   Chronic back pain  denies   Ambulates with difficulty? Denies. Husband reports "she does not walk, she runs!" Recent falls or head injuries? Denies.     Unilateral weakness, numbness or tingling? Denies.   Any tremors?  Denies   Any anosmia?  Denies   Any incontinence of urine?  Denies  Patient lives with her husband  Does the patient drive? No longer drives     Initial visit 4/11/2023[name] is a very pleasant 77 y.o. RH female  seen today in follow up for memory loss. This patient is accompanied in the office by her husband who supplements the history.  Previous records as well as any outside records available were reviewed prior to todays visit.  Patient underwent neurocognitive testing yielding a diagnosis of moderate dementia without behavioral disturbance.  She has been experiencing memory issues for the last 4 or 5 months.  Since that time, she the patient repeats herself, has begun to be more argumentative "she hates to be wrong ".  She admits to mood changes, but denies hallucinations or paranoia.  Her husband and her sister-in-law are the main caretakers.  There are no hygiene concerns.  Her medications are in a pillbox, her husband monitors them.  He is also in charge of the finances at this time.  Her appetite is good, denies trouble swallowing.  She does not cook.  She enjoys doing work finding, crossword puzzles, and until 5 years ago, she was a Science writer trouble swallowing.  She does not cook.  She ambulates without difficulty, denies any falls or head injuries.  She continues to drive without getting lost.  She denies any headache, double vision, dizziness, focal numbness or tingling, unilateral weakness, tremors or anosmia.  She has restless leg syndrome.  No history of  seizures, denies urine incontinence, retention, constipation or diarrhea.     MRI of the brain 07/16/2021, personally reviewed, was remarkable for generalized parenchymal volume loss, moderate chronic microvascular ischemic changes likely with few superimposed chronic small vessel infarcts.     Dementia panel LP sent to [facility] on 09/18/2021, is positive for 14 3 protein elevated at 5725.  Other results consistent with Alzheimer's disease.   PREVIOUS MEDICATIONS:   CURRENT MEDICATIONS:  Outpatient Encounter Medications as of 04/02/2023  Medication Sig   cholecalciferol (VITAMIN D) 1000 UNITS tablet Take 1,000 Units by mouth daily.   Multiple Vitamin (MULTIVITAMIN) tablet Take 1 tablet by mouth daily.   pantoprazole (PROTONIX) 20 MG tablet TAKE 1 TABLET EVERY DAY   rosuvastatin (CRESTOR) 20 MG tablet TAKE 1 TABLET EVERY DAY   vitamin B-12 (CYANOCOBALAMIN) 1000 MCG tablet Take 1,000 mcg by mouth daily. Reported on 11/07/2015   vitamin C (ASCORBIC ACID) 500 MG tablet Take 500 mg by mouth 2 (two) times daily.   [DISCONTINUED] donepezil (ARICEPT) 10 MG tablet TAKE 1/2 TABLET(5 MG) BY MOUTH DAILY FOR 2 WEEKS. INCREASE TO TAKE 1 TABLET EVERY DAY   [DISCONTINUED] memantine (NAMENDA) 10 MG tablet TAKE 1 TABLET AT BEDTIME   donepezil (ARICEPT) 10 MG tablet TAKE  1 TABLET EVERY DAY   memantine (NAMENDA) 10 MG tablet Take 1 tablet (10 mg total) by mouth at bedtime.   No facility-administered encounter medications on file as of 04/02/2023.       10/08/2022   11:39 AM  MMSE - Mini Mental State Exam  Not completed: Unable to complete       No data to display          Objective:     PHYSICAL EXAMINATION:    VITALS:   Vitals:   04/02/23 1412  BP: (!) 140/68  Pulse: 76  Resp: 18  SpO2: 98%  Weight: 120 lb (54.4 kg)  Height: 5\' 3"  (1.6 m)    GEN:  The patient appears stated age and is in NAD. HEENT:  Normocephalic, atraumatic.   Neurological examination:  General: NAD,  well-groomed, appears stated age. Orientation: The patient is alert. Oriented to person, not to place and date Cranial nerves: There is good facial symmetry.The speech is fluent and clear. No aphasia or dysarthria. Fund of knowledge is reduced. Recent and remote memory are impaired. Attention and concentration are reduced.  Able to name objects and unable to repeat phrases.  Hearing is intact to conversational tone.  Sensation: Sensation is intact to light touch throughout Motor: Strength is at least antigravity x4. DTR's 2/4 in UE/LE     Movement examination: Tone: There is normal tone in the UE/LE Abnormal movements:  no tremor.  No myoclonus.  No asterixis.   Coordination:  There is no decremation with RAM's. Normal finger to nose  Gait and Station: The patient has no difficulty arising out of a deep-seated chair without the use of the hands. The patient's stride length is good.  Gait is cautious and narrow.    Thank you for allowing [place] the opportunity to participate in the care of this nice patient. Please do not hesitate to contact us for any questions or concerns.   Total time spent on today's visit was 23 minutes dedicated to this patient today, preparing to see patient, examining the patient, ordering tests and/or medications and counseling the patient, documenting clinical information in the EHR or other health record, independently interpreting results and communicating results to the patient/family, discussing treatment and goals, answering patient's questions and coordinating care.  Cc:  Myrlene Broker, MD  Marlowe Kays 04/02/2023 2:31 PM

## 2023-05-30 ENCOUNTER — Other Ambulatory Visit: Payer: Self-pay | Admitting: Internal Medicine

## 2023-06-15 ENCOUNTER — Other Ambulatory Visit: Payer: Self-pay | Admitting: Internal Medicine

## 2023-08-10 ENCOUNTER — Other Ambulatory Visit: Payer: Self-pay | Admitting: Internal Medicine

## 2023-08-21 ENCOUNTER — Ambulatory Visit: Admitting: Physician Assistant

## 2023-08-21 ENCOUNTER — Encounter: Payer: Self-pay | Admitting: Physician Assistant

## 2023-08-21 VITALS — BP 184/76 | HR 80 | Resp 18 | Ht 63.0 in | Wt 117.0 lb

## 2023-08-21 DIAGNOSIS — F03B0 Unspecified dementia, moderate, without behavioral disturbance, psychotic disturbance, mood disturbance, and anxiety: Secondary | ICD-10-CM

## 2023-08-21 MED ORDER — MEMANTINE HCL 10 MG PO TABS: 10.0000 mg | ORAL_TABLET | Freq: Every day | ORAL | 3 refills | Status: DC

## 2023-08-21 MED ORDER — DONEPEZIL HCL 10 MG PO TABS: ORAL_TABLET | ORAL | 3 refills | Status: DC

## 2023-08-21 NOTE — Patient Instructions (Addendum)
 It was a pleasure to see you today at our office.   Recommendations:  Meds: Follow up in 6 month Continue donepezil 10 mg daily Memantine 10 mg at night    Visit the website in You Tube :Dementia Success Path   RECOMMENDATIONS FOR ALL PATIENTS WITH MEMORY PROBLEMS: 1. Continue to exercise (Recommend 30 minutes of walking everyday, or 3 hours every week) 2. Increase social interactions - continue going to [place] and enjoy social gatherings with friends and family 3. Eat healthy, avoid fried foods and eat more fruits and vegetables 4. Maintain adequate blood pressure, blood sugar, and blood cholesterol level. Reducing the risk of stroke and cardiovascular disease also helps promoting better memory. 5. Avoid stressful situations. Live a simple life and avoid aggravations. Organize your time and prepare for the next day in anticipation. 6. Sleep well, avoid any interruptions of sleep and avoid any distractions in the bedroom that may interfere with adequate sleep quality 7. Avoid sugar, avoid sweets as there is a strong link between excessive sugar intake, diabetes, and cognitive impairment We discussed the Mediterranean diet, which has been shown to help patients reduce the risk of progressive memory disorders and reduces cardiovascular risk. This includes eating fish, eat fruits and green leafy vegetables, nuts like almonds and hazelnuts, walnuts, and also use olive oil. Avoid fast foods and fried foods as much as possible. Avoid sweets and sugar as sugar use has been linked to worsening of memory function.  There is always a concern of gradual progression of memory problems. If this is the case, then we may need to adjust level of care according to patient needs. Support, both to the patient and caregiver, should then be put into place.       FALL PRECAUTIONS: Be cautious when walking. Scan the area for obstacles that may increase the risk of trips and falls. When getting up in the mornings,  sit up at the edge of the bed for a few minutes before getting out of bed. Consider elevating the bed at the head end to avoid drop of blood pressure when getting up. Walk always in a well-lit room (use night lights in the walls). Avoid area rugs or power cords from appliances in the middle of the walkways. Use a walker or a cane if necessary and consider physical therapy for balance exercise. Get your eyesight checked regularly.  FINANCIAL OVERSIGHT: Supervision, especially oversight when making financial decisions or transactions is also recommended.  HOME SAFETY: Consider the safety of the kitchen when operating appliances like stoves, microwave oven, and blender. Consider having supervision and share cooking responsibilities until no longer able to participate in those. Accidents with firearms and other hazards in the house should be identified and addressed as well.   ABILITY TO BE LEFT ALONE: If patient is unable to contact 911 operator, consider using LifeLine, or when the need is there, arrange for someone to stay with patients. Smoking is a fire hazard, consider supervision or cessation. Risk of wandering should be assessed by caregiver and if detected at any point, supervision and safe proof recommendations should be instituted.  MEDICATION SUPERVISION: Inability to self-administer medication needs to be constantly addressed. Implement a mechanism to ensure safe administration of the medications.   DRIVING: Regarding driving, in patients with progressive memory problems, driving will be impaired. We advise to have someone else do the driving if trouble finding directions or if minor accidents are reported. Independent driving assessment is available to determine safety of driving.  If you are interested in the driving assessment, you can contact the following:  [employer] in [place] [phone]  [facility] [phone]  [facility]  [phone] or [phone]      Mediterranean Diet A Mediterranean diet refers to food and lifestyle choices that are based on the traditions of countries located on the [employer]. This way of eating has been shown to help prevent certain conditions and improve outcomes for people who have chronic diseases, like kidney disease and heart disease. What are tips for following this plan? Lifestyle  Cook and eat meals together with your family, when possible. Drink enough fluid to keep your urine clear or pale yellow. Be physically active every day. This includes: Aerobic exercise like running or swimming. Leisure activities like gardening, walking, or housework. Get 7-8 hours of sleep each night. If recommended by your health care provider, drink red wine in moderation. This means 1 glass a day for nonpregnant women and 2 glasses a day for men. A glass of wine equals 5 oz (150 mL). Reading food labels  Check the serving size of packaged foods. For foods such as rice and pasta, the serving size refers to the amount of cooked product, not dry. Check the total fat in packaged foods. Avoid foods that have saturated fat or trans fats. Check the ingredients list for added sugars, such as corn syrup. Shopping  At the grocery store, buy most of your food from the areas near the walls of the store. This includes: Fresh fruits and vegetables (produce). Grains, beans, nuts, and seeds. Some of these may be available in unpackaged forms or large amounts (in bulk). Fresh seafood. Poultry and eggs. Low-fat dairy products. Buy whole ingredients instead of prepackaged foods. Buy fresh fruits and vegetables in-season from local farmers markets. Buy frozen fruits and vegetables in resealable bags. If you do not have access to quality fresh seafood, buy precooked frozen shrimp or canned fish, such as tuna, salmon, or sardines. Buy small amounts of raw or cooked  vegetables, salads, or olives from the deli or salad bar at your store. Stock your pantry so you always have certain foods on hand, such as olive oil, canned tuna, canned tomatoes, rice, pasta, and beans. Cooking  Cook foods with extra-virgin olive oil instead of using butter or other vegetable oils. Have meat as a side dish, and have vegetables or grains as your main dish. This means having meat in small portions or adding small amounts of meat to foods like pasta or stew. Use beans or vegetables instead of meat in common dishes like chili or lasagna. Experiment with different cooking methods. Try roasting or broiling vegetables instead of steaming or sauteing them. Add frozen vegetables to soups, stews, pasta, or rice. Add nuts or seeds for added healthy fat at each meal. You can add these to yogurt, salads, or vegetable dishes. Marinate fish or vegetables using olive oil, lemon juice, garlic, and fresh herbs. Meal planning  Plan to eat 1 vegetarian meal one day each week. Try to work up to 2 vegetarian meals, if possible. Eat seafood 2 or more times a week. Have healthy snacks readily available, such as: Vegetable sticks with hummus. Greek yogurt. Fruit and nut trail mix. Eat balanced meals throughout the week. This includes: Fruit: 2-3 servings a day Vegetables: 4-5 servings a day Low-fat dairy: 2 servings a day Fish, poultry, or lean meat: 1 serving a day Beans and legumes: 2  or more servings a week Nuts and seeds: 1-2 servings a day Whole grains: 6-8 servings a day Extra-virgin olive oil: 3-4 servings a day Limit red meat and sweets to only a few servings a month What are my food choices? Mediterranean diet Recommended Grains: Whole-grain pasta. Brown rice. Bulgar wheat. Polenta. Couscous. Whole-wheat bread. Orpah Cobb. Vegetables: Artichokes. Beets. Broccoli. Cabbage. Carrots. Eggplant. Green beans. Chard. Kale. Spinach. Onions. Leeks. Peas. Squash. Tomatoes. Peppers.  Radishes. Fruits: Apples. Apricots. Avocado. Berries. Bananas. Cherries. Dates. Figs. Grapes. Lemons. Melon. Oranges. Peaches. Plums. Pomegranate. Meats and other protein foods: Beans. Almonds. Sunflower seeds. Pine nuts. Peanuts. Cod. Salmon. Scallops. Shrimp. Tuna. Tilapia. Clams. Oysters. Eggs. Dairy: Low-fat milk. Cheese. Greek yogurt. Beverages: Water. Red wine. Herbal tea. Fats and oils: Extra virgin olive oil. Avocado oil. Grape seed oil. Sweets and desserts: [place] yogurt with honey. Baked apples. Poached pears. Trail mix. Seasoning and other foods: Basil. Cilantro. Coriander. Cumin. Mint. Parsley. Sage. Rosemary. Tarragon. Garlic. Oregano. Thyme. Pepper. Balsalmic vinegar. Tahini. Hummus. Tomato sauce. Olives. Mushrooms. Limit these Grains: Prepackaged pasta or rice dishes. Prepackaged cereal with added sugar. Vegetables: Deep fried potatoes (french fries). Fruits: Fruit canned in syrup. Meats and other protein foods: Beef. Pork. Lamb. Poultry with skin. Hot dogs. Tomasa Blase. Dairy: Ice cream. Sour cream. Whole milk. Beverages: Juice. Sugar-sweetened soft drinks. Beer. Liquor and spirits. Fats and oils: Butter. Canola oil. Vegetable oil. Beef fat (tallow). Lard. Sweets and desserts: Cookies. Cakes. Pies. Candy. Seasoning and other foods: Mayonnaise. Premade sauces and marinades. The items listed may not be a complete list. Talk with your dietitian about what dietary choices are right for you. Summary The Mediterranean diet includes both food and lifestyle choices. Eat a variety of fresh fruits and vegetables, beans, nuts, seeds, and whole grains. Limit the amount of red meat and sweets that you eat. Talk with your health care provider about whether it is safe for you to drink red wine in moderation. This means 1 glass a day for nonpregnant women and 2 glasses a day for men. A glass of wine equals 5 oz (150 mL). This information is not intended to replace advice given to you by your health  care provider. Make sure you discuss any questions you have with your health care provider. Document Released: 01/12/2016 Document Revised: 02/14/2016 Document Reviewed: 01/12/2016 Elsevier Interactive Patient Education  2017 [employer].  Your provider has requested that you have labwork completed today. Please go to [facility] ([id number]) on the second floor of this building before leaving the office today. You do not need to check in. If you are not called within 15 minutes please check with the front desk.   We have sent a referral to [facility] for your Lumbar PunctureI and they will call you directly to schedule your appointment. They are located at [address] [facility]. If you need to contact them directly please call [phone].

## 2023-08-21 NOTE — Progress Notes (Signed)
 Assessment/Plan:   Dementia likely due to Alzheimer's disease without behavioral disturbance  Gina King is a very pleasant 78 y.o. RH female with a history of vitamin D deficiency, GERD, hyperlipidemia, RLS, history of stroke in 2023, hypertension, and a diagnosis of Alzheimer's disease without behavioral disturbance  seen today in follow up for memory loss. Patient is currently on donepezil 10 mg daily and memantine 10 mg nightly.  Memory is stable. Patient is able to participate on her ADLs, no longer drives.    Follow up in  6 months. Continue donepezil 10 mg daily and memantine 10 mg daily, side effects discussed. Patient has been informed of elevated blood pressure.  Recommend good control of her cardiovascular risk factors Continue to control mood as per PCP     Subjective:    This patient is accompanied in the office by her husband who supplements the history.  Previous records as well as any outside records available were reviewed prior to todays visit. Patient was last seen on 04/02/2023, unable to perform MMSE due to cognitive decline.  Any changes in memory since last visit? "Some good and bad days, is worse in the afternoon ".  Short-term memory is worse than long-term memory. "Yesterday, she forgot that he was not  in the house". She continues to read.  Does not like to participate in activities outside of the house.  She has developed some habits  such as playing with a loose tooth repeats oneself?  Endorsed Disoriented when walking into a room?  Patient denies   Leaving objects?  May misplace things but not in unusual places.   Wandering behavior?  denies   Any personality changes since last visit?  denies   Any worsening depression?:  Denies.   Hallucinations or paranoia?  She asked recently "Where are the people who used to stay with [place]?". "How is your brother"(but she does not remember the name of her brother in law) Seizures? denies    Any sleep changes?  Sleeps  well.  Denies vivid dreams, REM behavior or sleepwalking   Sleep apnea?   Denies.   Any hygiene concerns? Denies.  Independent of bathing and dressing?  Endorsed  Does the patient needs help with medications?  Husband is in charge  Who is in charge of the finances?  Husband is in charge    Any changes in appetite? She may eat 6-8 bites, drinks plenty water. She loves hot chocolate.     Patient have trouble swallowing? Denies.   Does the patient cook? No, husband does the cooking. Any headaches?   Denies.  Chronic back pain  denies.   Ambulates with difficulty? Denies. She walks around [place], [place]. She is very active     Recent falls or head injuries? Denies.     Unilateral weakness, numbness or tingling? Denies.   Any tremors?  Denies   Any anosmia?  Denies   Any incontinence of urine?  Denies Any bowel dysfunction?   Denies      Patient lives with her husband  Does the patient drive? No longer drives    Initial visit 4/11/2023[name] is a very pleasant 78 y.o. RH female  seen today in follow up for memory loss. This patient is accompanied in the office by her husband who supplements the history.  Previous records as well as any outside records available were reviewed prior to todays visit.  Patient underwent neurocognitive testing yielding a diagnosis of moderate dementia without behavioral  disturbance.  She has been experiencing memory issues for the last 4 or 5 months.  Since that time, she the patient repeats herself, has begun to be more argumentative "she hates to be wrong ".  She admits to mood changes, but denies hallucinations or paranoia.  Her husband and her sister-in-law are the main caretakers.  There are no hygiene concerns.  Her medications are in a pillbox, her husband monitors them.  He is also in charge of the finances at this time.  Her appetite is good, denies trouble swallowing.  She does not cook.  She enjoys doing work finding, crossword puzzles, and until 5 years ago,  she was a Science writer trouble swallowing.  She does not cook.  She ambulates without difficulty, denies any falls or head injuries.  She continues to drive without getting lost.  She denies any headache, double vision, dizziness, focal numbness or tingling, unilateral weakness, tremors or anosmia.  She has restless leg syndrome.  No history of seizures, denies urine incontinence, retention, constipation or diarrhea.     MRI of the brain 07/16/2021, personally reviewed, was remarkable for generalized parenchymal volume loss, moderate chronic microvascular ischemic changes likely with few superimposed chronic small vessel infarcts.     Dementia panel LP sent to [facility] on 09/18/2021, is positive for 14 3 protein elevated at 5725.  Other results consistent with Alzheimer's disease.  PREVIOUS MEDICATIONS:   CURRENT MEDICATIONS:  Outpatient Encounter Medications as of 08/21/2023  Medication Sig   cholecalciferol (VITAMIN D) 1000 UNITS tablet Take 1,000 Units by mouth daily.   Multiple Vitamin (MULTIVITAMIN) tablet Take 1 tablet by mouth daily.   pantoprazole (PROTONIX) 20 MG tablet TAKE 1 TABLET EVERY DAY   rosuvastatin (CRESTOR) 20 MG tablet TAKE 1 TABLET EVERY DAY   vitamin B-12 (CYANOCOBALAMIN) 1000 MCG tablet Take 1,000 mcg by mouth daily. Reported on 11/07/2015   vitamin C (ASCORBIC ACID) 500 MG tablet Take 500 mg by mouth 2 (two) times daily.   [DISCONTINUED] donepezil (ARICEPT) 10 MG tablet TAKE  1 TABLET EVERY DAY   [DISCONTINUED] memantine (NAMENDA) 10 MG tablet Take 1 tablet (10 mg total) by mouth at bedtime.   donepezil (ARICEPT) 10 MG tablet TAKE  1 TABLET EVERY DAY   memantine (NAMENDA) 10 MG tablet Take 1 tablet (10 mg total) by mouth at bedtime.   No facility-administered encounter medications on file as of 08/21/2023.       10/08/2022   11:39 AM  MMSE - Mini Mental State Exam  Not completed: Unable to complete       No data to display          Objective:      PHYSICAL EXAMINATION:    VITALS:   Vitals:   08/21/23 1300 08/21/23 1342  BP: (!) 160/88 (!) 184/76  Pulse: 80   Resp: 18   SpO2: 98%   Weight: 117 lb (53.1 kg)   Height: 5\' 3"  (1.6 m)     GEN:  The patient appears stated age and is in NAD. HEENT:  Normocephalic, atraumatic.   Neurological examination:  General: NAD, well-groomed, appears stated age. Orientation: The patient is alert. Oriented to person, not to place and date Cranial nerves: There is good facial symmetry.The speech is fluent and clear. No aphasia or dysarthria. Fund of knowledge is reduced. Recent and remote memory are impaired. Attention and concentration are reduced.  Able to name objects and unable to repeat phrases.  Hearing is intact to conversational  tone.   Sensation: Sensation is intact to light touch throughout Motor: Strength is at least antigravity x4. DTR's 2/4 in UE/LE     Movement examination: Tone: There is normal tone in the UE/LE Abnormal movements:  no tremor.  No myoclonus.  No asterixis.   Coordination:  There is no decremation with RAM's. Normal finger to nose  Gait and Station: The patient has no difficulty arising out of a deep-seated chair without the use of the hands. The patient's stride length is good.  Gait is cautious and narrow.    Thank you for allowing [place] the opportunity to participate in the care of this nice patient. Please do not hesitate to contact us for any questions or concerns.   Total time spent on today's visit was 27 minutes dedicated to this patient today, preparing to see patient, examining the patient, ordering tests and/or medications and counseling the patient, documenting clinical information in the EHR or other health record, independently interpreting results and communicating results to the patient/family, discussing treatment and goals, answering patient's questions and coordinating care.  Cc:  Myrlene Broker, MD  Marlowe Kays 08/21/2023 5:02 PM

## 2023-08-29 ENCOUNTER — Other Ambulatory Visit: Payer: Self-pay | Admitting: Internal Medicine

## 2023-10-03 ENCOUNTER — Telehealth: Payer: Self-pay | Admitting: Internal Medicine

## 2023-10-03 NOTE — Telephone Encounter (Signed)
 Copied from CRM [id number]. Topic: Clinical - Medication Question >> Oct 03, 2023 11:17 AM Marlan Silva wrote: Reason for CRM: [facility] Pharmacist Rep called in stating that she wanted to know if the patient is still on her medication Rouvastatin 20mg . Pharmacist can be reached at [phone] [facility], she has no direct ext. And she stated that Dr. Morey Ar nurse can speak to any pharmacist in her department.

## 2023-10-09 ENCOUNTER — Ambulatory Visit: Payer: Medicare HMO

## 2023-10-09 VITALS — Ht 64.0 in | Wt 117.0 lb

## 2023-10-09 DIAGNOSIS — Z0000 Encounter for general adult medical examination without abnormal findings: Secondary | ICD-10-CM | POA: Diagnosis not present

## 2023-10-09 DIAGNOSIS — K635 Polyp of colon: Secondary | ICD-10-CM

## 2023-10-09 NOTE — Telephone Encounter (Signed)
 Called back and informed that this medication is still active on the patient med list and she is still taking this medication

## 2023-10-09 NOTE — Patient Instructions (Addendum)
 Gina King , Thank you for taking time to come for your Medicare Wellness Visit. I appreciate your ongoing commitment to your health goals. Please review the following plan we discussed and let me know if I can assist you in the future.   Referrals/Orders/Follow-Ups/Clinician Recommendations: Aim for 30 minutes of exercise or brisk walking, 6-8 glasses of water, and 5 servings of fruits and vegetables each day. Educated and advised on having a repeat Colonoscopy and Tdap (Tetenus) and Covid vaccines.  This is a list of the screening recommended for you and due dates:  Health Maintenance  Topic Date Due   DTaP/Tdap/Td vaccine (3 - Tdap) 12/21/2018   COVID-19 Vaccine (6 - 2024-25 season) 02/03/2023   Flu Shot  01/03/2024   Colon Cancer Screening  03/04/2024   Medicare Annual Wellness Visit  10/08/2024   Pneumonia Vaccine  Completed   DEXA scan (bone density measurement)  Completed   Hepatitis C Screening  Completed   Zoster (Shingles) Vaccine  Completed   HPV Vaccine  Aged Out   Meningitis B Vaccine  Aged Out    Advanced directives: (Copy Requested) Please bring a copy of your health care power of attorney and living will to the office to be added to your chart at your convenience. You can mail to [facility] [address] [place], [place] [address] or email to [email or web address]  Next Medicare Annual Wellness Visit scheduled for next year: Yes  Have you seen your provider in the last 6 months (3 months if uncontrolled diabetes)? No- Scheduled a Physical for the pt for 10/11/2023 at 8:20a.

## 2023-10-09 NOTE — Progress Notes (Cosign Needed Addendum)
 Subjective:  Please attest and cosign this visit due to patients primary care provider not being in the office at the time the visit was completed.  (Pt of Dr Bambi Lever)   Gina King is a 78 y.o. who presents for a Medicare Wellness preventive visit.  Visit Complete: Virtual I connected with  Andreana Sigur Soldo on 10/09/23 by a audio enabled telemedicine application and verified that I am speaking with the correct person using two identifiers.  Patient Location: Home  Provider Location: Office/Clinic  I discussed the limitations of evaluation and management by telemedicine. The patient expressed understanding and agreed to proceed.  Vital Signs: Because this visit was a virtual/telehealth visit, some criteria may be missing or patient reported. Any vitals not documented were not able to be obtained and vitals that have been documented are patient reported.  VideoDeclined- This patient declined Librarian, academic. Therefore the visit was completed with audio only.  Persons Participating in Visit: Patient assisted by Robie Cho.  AWV Questionnaire: No: Patient Medicare AWV questionnaire was not completed prior to this visit.  Cardiac Risk Factors include: advanced age (>7men, >54 women);hypertension;dyslipidemia     Objective:    Today's Vitals   10/09/23 1254  Weight: 117 lb (53.1 kg)  Height: 5\' 4"  (1.626 m)   Body mass index is 20.08 kg/m.     10/09/2023   12:53 PM 08/21/2023    1:04 PM 04/02/2023    2:16 PM 10/08/2022   11:21 AM 10/01/2022    2:29 PM 03/14/2022    2:39 PM 09/12/2021    8:07 AM  Advanced Directives  Does Patient Have a Medical Advance Directive? Yes No No No No Yes No  Type of Estate agent of [place];Living will        Copy of Healthcare Power of Attorney in Chart? No - copy requested        Would patient like information on creating a medical advance directive?  No - Patient declined No -  Patient declined No - Patient declined       Current Medications (verified) Outpatient Encounter Medications as of 10/09/2023  Medication Sig   cholecalciferol (VITAMIN D ) 1000 UNITS tablet Take 1,000 Units by mouth daily.   donepezil  (ARICEPT ) 10 MG tablet TAKE  1 TABLET EVERY DAY   memantine  (NAMENDA ) 10 MG tablet Take 1 tablet (10 mg total) by mouth at bedtime.   Multiple Vitamin (MULTIVITAMIN) tablet Take 1 tablet by mouth daily.   pantoprazole  (PROTONIX ) 20 MG tablet TAKE 1 TABLET EVERY DAY   rosuvastatin  (CRESTOR ) 20 MG tablet TAKE 1 TABLET EVERY DAY   vitamin B-12 (CYANOCOBALAMIN ) 1000 MCG tablet Take 1,000 mcg by mouth daily. Reported on 11/07/2015   vitamin C (ASCORBIC ACID) 500 MG tablet Take 500 mg by mouth 2 (two) times daily.   No facility-administered encounter medications on file as of 10/09/2023.    Allergies (verified) Gabapentin   History: Past Medical History:  Diagnosis Date   CVA (cerebral vascular accident)    07/2021 MRI - Moderate chronic microvascular ischemic changes with a few superimposed chronic small vessel infarcts   GERD (gastroesophageal reflux disease) 12/30/2006   Mixed hyperlipidemia 07/28/2007   Moderate dementia, unclear etiology 08/25/2021   Numbness and tingling of left leg 11/30/2020   Osteopenia 12/30/2006   Restless leg syndrome 06/23/2021   Vitamin D  deficiency 07/28/2007   Past Surgical History:  Procedure Laterality Date   ABDOMINAL HYSTERECTOMY  1984  Family History  Problem Relation Age of Onset   Memory loss Father        with advanced age and other medical complications   Memory loss Brother        concerns for dementia presentation   Kidney failure Brother    Colon cancer Neg Hx    Social History   Socioeconomic History   Marital status: Married    Spouse name: Not on file   Number of children: 2   Years of education: 10   Highest education level: 10th grade  Occupational History   Occupation: retired    Associate Professor:  Education administrator  Tobacco Use   Smoking status: Never    Passive exposure: Never   Smokeless tobacco: Never  Vaping Use   Vaping status: Never Used  Substance and Sexual Activity   Alcohol use: No   Drug use: No   Sexual activity: Not Currently  Other Topics Concern   Not on file  Social History Narrative   Right handed   Drinks caffeine   Two story home   Lives with husband   Social Drivers of Health   Financial Resource Strain: Low Risk  (10/09/2023)   Overall Financial Resource Strain (CARDIA)    Difficulty of Paying Living Expenses: Not hard at all  Food Insecurity: No Food Insecurity (10/09/2023)   Hunger Vital Sign    Worried About Running Out of Food in the Last Year: Never true    Ran Out of Food in the Last Year: Never true  Transportation Needs: No Transportation Needs (10/09/2023)   PRAPARE - Administrator, Civil Service (Medical): No    Lack of Transportation (Non-Medical): No  Physical Activity: Sufficiently Active (10/09/2023)   Exercise Vital Sign    Days of Exercise per Week: 3 days    Minutes of Exercise per Session: 60 min  Stress: No Stress Concern Present (10/09/2023)   [employer] of Occupational Health - Occupational Stress Questionnaire    Feeling of Stress : Not at all  Social Connections: Moderately Isolated (10/09/2023)   Social Connection and Isolation Panel [NHANES]    Frequency of Communication with Friends and Family: More than three times a week    Frequency of Social Gatherings with Friends and Family: Twice a week    Attends Religious Services: Never    Database administrator or Organizations: No    Attends Engineer, structural: Never    Marital Status: Married    Tobacco Counseling Counseling given: No    Clinical Intake:  Pre-visit preparation completed: Yes  Pain : No/denies pain     BMI - recorded: 20.08 Nutritional Status: BMI of 19-24  Normal Nutritional Risks: None Diabetes: No  Lab Results   Component Value Date   HGBA1C 5.7 12/16/2018     How often do you need to have someone help you when you read instructions, pamphlets, or other written materials from your doctor or pharmacy?: 3 - Sometimes (Spouse helps)  Interpreter Needed?: No  Information entered by :: Kandy Orris, CMA   Activities of Daily Living     10/09/2023   12:56 PM  In your present state of health, do you have any difficulty performing the following activities:  Hearing? 0  Vision? 0  Difficulty concentrating or making decisions? 0  Walking or climbing stairs? 0  Dressing or bathing? 0  Doing errands, shopping? 1  Comment Spouse helps  Preparing Food and eating ? Fernande Howells  Comment Spouse helps  Using the Toilet? N  In the past six months, have you accidently leaked urine? N  Do you have problems with loss of bowel control? N  Managing your Medications? Y  Comment Spouse helps  Managing your Finances? Y  Comment Spouse handles  Housekeeping or managing your Housekeeping? Y  Comment Spouse helps    Patient Care Team: Adelia Homestead, MD as PCP - General (Internal Medicine) Eilleen Grates, MD as PCP - Cardiology (Cardiology) Sarina Curb as Consulting Physician (Optometry) [facility] as Consulting Physician (Optometry)  Indicate any recent Medical Services you may have received from other than [facility] providers in the past year (date may be approximate).     Assessment:   This is a routine wellness examination for [place].  Hearing/Vision screen Hearing Screening - Comments:: Denies hearing difficulties   Vision Screening - Comments:: Denies vision concerns.  Up-to-date with eye exam w/[facility]   Goals Addressed               This Visit's Progress     Patient Stated (pt-stated)        Patient stated she plans to continue walking and staying active.       Depression Screen     10/09/2023    1:04 PM 10/08/2022   11:22 AM 06/26/2022   11:43 AM 09/07/2021    11:22 AM 06/23/2021    3:00 PM 08/29/2020    1:16 PM 10/31/2018   10:14 AM  PHQ 2/9 Scores  PHQ - 2 Score 2 0 0 0 0 0 0  PHQ- 9 Score 7 3         Fall Risk     10/09/2023   12:56 PM 08/21/2023    1:04 PM 04/02/2023    2:16 PM 10/08/2022   11:36 AM 10/04/2022   10:25 AM  Fall Risk   Falls in the past year? 0 0 0 0 0  Number falls in past yr: 0 0 0 0 0  Injury with Fall? 0 0 0 0 0  Risk for fall due to : No Fall Risks   No Fall Risks   Follow up Falls prevention discussed;Falls evaluation completed Falls evaluation completed Falls evaluation completed Falls prevention discussed     MEDICARE RISK AT HOME:  Medicare Risk at Home Any stairs in or around the home?: Yes If so, are there any without handrails?: No Home free of loose throw rugs in walkways, pet beds, electrical cords, etc?: Yes Adequate lighting in your home to reduce risk of falls?: Yes Life alert?: No Use of a cane, walker or w/c?: No Grab bars in the bathroom?: Yes Shower chair or bench in shower?: No Elevated toilet seat or a handicapped toilet?: No  TIMED UP AND GO:  Was the test performed?  No  Cognitive Function: 6CIT completed    10/08/2022   11:39 AM  MMSE - Mini Mental State Exam  Not completed: Unable to complete        10/09/2023   12:57 PM 09/07/2021   11:35 AM  6CIT Screen  What Year? 4 points 0 points  What month? 3 points 0 points  What time? 3 points 0 points  Count back from 20 4 points 0 points  Months in reverse 4 points 0 points  Repeat phrase 10 points 0 points  Total Score 28 points 0 points    Immunizations Immunization History  Administered Date(s) Administered   Fluad Quad(high  Dose 65+) 02/16/2022   Influenza Split 03/04/2013   Influenza Whole 04/18/2010   Influenza, High Dose Seasonal PF 02/13/2016   Influenza, Seasonal, Injecte, Preservative Fre 03/04/2013   Influenza,inj,Quad PF,6+ Mos 02/22/2014   Influenza-Unspecified 03/18/2018, 02/25/2019, 03/04/2020, 03/04/2021    PFIZER(Purple Top)SARS-COV-2 Vaccination 07/31/2019, 08/26/2019, 03/15/2020, 09/02/2020   Pfizer Covid-19 Vaccine Bivalent Booster 75yrs & up 02/15/2021   Pneumococcal Conjugate-13 11/07/2015   Pneumococcal Polysaccharide-23 02/22/2014   Td 06/04/1997, 12/20/2008   Zoster Recombinant(Shingrix) 09/09/2020, 11/23/2020   Zoster, Live 11/07/2015    Screening Tests Health Maintenance  Topic Date Due   DTaP/Tdap/Td (3 - Tdap) 12/21/2018   COVID-19 Vaccine (6 - 2024-25 season) 02/03/2023   INFLUENZA VACCINE  01/03/2024   Colonoscopy  03/04/2024   Medicare Annual Wellness (AWV)  10/08/2024   Pneumonia Vaccine 53+ Years old  Completed   DEXA SCAN  Completed   Hepatitis C Screening  Completed   Zoster Vaccines- Shingrix  Completed   HPV VACCINES  Aged Out   Meningococcal B Vaccine  Aged Out    Health Maintenance  Health Maintenance Due  Topic Date Due   DTaP/Tdap/Td (3 - Tdap) 12/21/2018   COVID-19 Vaccine (6 - 2024-25 season) 02/03/2023   Health Maintenance Items Addressed:  Referral sent to GI for colonoscopy  Additional Screening:  Vision Screening: Recommended annual ophthalmology exams for early detection of glaucoma and other disorders of the eye.  Dental Screening: Recommended annual dental exams for proper oral hygiene  Community Resource Referral / Chronic Care Management: CRR required this visit?  No   CCM required this visit?  No     Plan:     I have personally reviewed and noted the following in the patient's chart:   Medical and social history Use of alcohol, tobacco or illicit drugs  Current medications and supplements including opioid prescriptions. Patient is not currently taking opioid prescriptions. Functional ability and status Nutritional status Physical activity Advanced directives List of other physicians Hospitalizations, surgeries, and ER visits in previous 12 months Vitals Screenings to include cognitive, depression, and falls Referrals  and appointments  In addition, I have reviewed and discussed with patient certain preventive protocols, quality metrics, and best practice recommendations. A written personalized care plan for preventive services as well as general preventive health recommendations were provided to patient.     Patria Bookbinder, CMA   10/09/2023   After Visit Summary: Due to being a telephonic visit, patient and Spouse will review information via MyChart.    Notes: Please refer to Routing Comments. Scheduled a Physical for pt (last seen PCP in 09/2021) for 10/11/2023.  Medical screening examination/treatment/procedure(s) were performed by non-physician practitioner and as supervising physician I was immediately available for consultation/collaboration.  I agree with above. Adelaide Holy, MD

## 2023-10-11 ENCOUNTER — Ambulatory Visit (INDEPENDENT_AMBULATORY_CARE_PROVIDER_SITE_OTHER): Admitting: Internal Medicine

## 2023-10-11 ENCOUNTER — Encounter: Payer: Self-pay | Admitting: Internal Medicine

## 2023-10-11 VITALS — BP 124/80 | HR 78 | Temp 97.6°F | Ht 64.0 in | Wt 118.0 lb

## 2023-10-11 DIAGNOSIS — E782 Mixed hyperlipidemia: Secondary | ICD-10-CM

## 2023-10-11 DIAGNOSIS — F02B0 Dementia in other diseases classified elsewhere, moderate, without behavioral disturbance, psychotic disturbance, mood disturbance, and anxiety: Secondary | ICD-10-CM

## 2023-10-11 DIAGNOSIS — I10 Essential (primary) hypertension: Secondary | ICD-10-CM | POA: Diagnosis not present

## 2023-10-11 DIAGNOSIS — Z0000 Encounter for general adult medical examination without abnormal findings: Secondary | ICD-10-CM

## 2023-10-11 DIAGNOSIS — Z8673 Personal history of transient ischemic attack (TIA), and cerebral infarction without residual deficits: Secondary | ICD-10-CM

## 2023-10-11 DIAGNOSIS — K219 Gastro-esophageal reflux disease without esophagitis: Secondary | ICD-10-CM | POA: Diagnosis not present

## 2023-10-11 DIAGNOSIS — G301 Alzheimer's disease with late onset: Secondary | ICD-10-CM

## 2023-10-11 DIAGNOSIS — Z0001 Encounter for general adult medical examination with abnormal findings: Secondary | ICD-10-CM

## 2023-10-11 LAB — COMPREHENSIVE METABOLIC PANEL WITH GFR
ALT: 14 U/L (ref 0–35)
AST: 22 U/L (ref 0–37)
Albumin: 4.1 g/dL (ref 3.5–5.2)
Alkaline Phosphatase: 53 U/L (ref 39–117)
BUN: 11 mg/dL (ref 6–23)
CO2: 30 meq/L (ref 19–32)
Calcium: 9.8 mg/dL (ref 8.4–10.5)
Chloride: 104 meq/L (ref 96–112)
Creatinine, Ser: 0.86 mg/dL (ref 0.40–1.20)
GFR: 66.02 mL/min (ref >60.00)
Glucose, Bld: 107 mg/dL — ABNORMAL HIGH (ref 70–99)
Potassium: 4.2 meq/L (ref 3.5–5.1)
Sodium: 141 meq/L (ref 135–145)
Total Bilirubin: 0.6 mg/dL (ref 0.2–1.2)
Total Protein: 7 g/dL (ref 6.0–8.3)

## 2023-10-11 LAB — LIPID PANEL
Cholesterol: 174 mg/dL (ref 0–200)
HDL: 59 mg/dL (ref >39.00)
LDL Cholesterol: 78 mg/dL (ref 0–99)
NonHDL: 114.57
Total CHOL/HDL Ratio: 3
Triglycerides: 183 mg/dL — ABNORMAL HIGH (ref 0.0–149.0)
VLDL: 36.6 mg/dL (ref 0.0–40.0)

## 2023-10-11 LAB — CBC
HCT: 44.7 % (ref 36.0–46.0)
Hemoglobin: 14.9 g/dL (ref 12.0–15.0)
MCHC: 33.3 g/dL (ref 30.0–36.0)
MCV: 94.3 fl (ref 78.0–100.0)
Platelets: 267 10*3/uL (ref 150.0–400.0)
RBC: 4.74 Mil/uL (ref 3.87–5.11)
RDW: 13.6 % (ref 11.5–15.5)
WBC: 5.3 10*3/uL (ref 4.0–10.5)

## 2023-10-11 MED ORDER — MEMANTINE HCL 10 MG PO TABS: 10.0000 mg | ORAL_TABLET | Freq: Two times a day (BID) | ORAL | 3 refills | Status: DC

## 2023-10-11 NOTE — Assessment & Plan Note (Signed)
 Some increase in symptoms will increase memantine  to 10 mg BID and keep aricept  10 mg daily. New rx done.

## 2023-10-11 NOTE — Assessment & Plan Note (Signed)
 Checking lipid panel and adjust crestor as needed.

## 2023-10-11 NOTE — Progress Notes (Signed)
   Subjective:   Patient ID: Gina King, female    DOB: 08-Jan-1946, 78 y.o.   MRN: [id number]  HPI The patient is here for physical.  PMH, [facility], social history reviewed and updated  Review of Systems  Unable to perform ROS: Dementia  Constitutional: Negative.   HENT: Negative.    Eyes: Negative.   Respiratory:  Negative for cough, chest tightness and shortness of breath.   Cardiovascular:  Negative for chest pain, palpitations and leg swelling.  Gastrointestinal:  Negative for abdominal distention, abdominal pain, constipation, diarrhea, nausea and vomiting.  Musculoskeletal: Negative.   Skin: Negative.   Neurological: Negative.   Psychiatric/Behavioral: Negative.      Objective:  Physical Exam Constitutional:      Appearance: She is well-developed.  HENT:     Head: Normocephalic and atraumatic.  Cardiovascular:     Rate and Rhythm: Normal rate and regular rhythm.  Pulmonary:     Effort: Pulmonary effort is normal. No respiratory distress.     Breath sounds: Normal breath sounds. No wheezing or rales.  Abdominal:     General: Bowel sounds are normal. There is no distension.     Palpations: Abdomen is soft.     Tenderness: There is no abdominal tenderness. There is no rebound.  Musculoskeletal:     Cervical back: Normal range of motion.  Skin:    General: Skin is warm and dry.  Neurological:     Mental Status: She is alert. Mental status is at baseline.     Coordination: Coordination normal.     Vitals:   10/11/23 0833  BP: 124/80  Pulse: 78  Temp: 97.6 F (36.4 C)  TempSrc: Temporal  SpO2: 95%  Weight: 118 lb (53.5 kg)  Height: 5\' 4"  (1.626 m)    Assessment & Plan:

## 2023-10-11 NOTE — Assessment & Plan Note (Signed)
 Flu shot yearly. Pneumonia compelte. Shingrix complete. Tetanus due at pharmacy. Colonoscopy aged out. Mammogram aged out, pap smear aged out and dexa complete. Counseled about sun safety and mole surveillance. Counseled about the dangers of distracted driving. Given 10 year screening recommendations.

## 2023-10-11 NOTE — Assessment & Plan Note (Signed)
 BP at goal on no meds. Continue close monitoring.

## 2023-10-11 NOTE — Assessment & Plan Note (Signed)
 Taking protonix  20 mg daily and continue.

## 2023-10-11 NOTE — Assessment & Plan Note (Signed)
 No new symptoms. BP at goal checking lipid panel.

## 2023-10-14 ENCOUNTER — Encounter: Payer: Self-pay | Admitting: Internal Medicine

## 2023-10-16 ENCOUNTER — Ambulatory Visit: Payer: Self-pay

## 2023-10-30 ENCOUNTER — Telehealth: Payer: Self-pay

## 2023-10-30 NOTE — Telephone Encounter (Signed)
 This patient is appearing on a report for being at risk of failing the adherence measure for cholesterol (statin) medications this calendar year.   Medication: rosuvastatin  20 mg Last fill date: 06/17/23 for 90 day supply  Previous rosuvastatin  fills have often been filled early. Current refill is overdue based on last fill date but patient likely has surplus at home given history of multiple early refills. Attempted to contact patient to ask for need for refill but unable to leave voicemail. Of note, patient to fail adherence measure if not filled by 11/24/23.    Abelina Abide, PharmD PGY1 Pharmacy Resident 10/30/2023 10:09 AM

## 2023-11-07 ENCOUNTER — Telehealth: Payer: Self-pay | Admitting: Physician Assistant

## 2023-11-07 NOTE — Telephone Encounter (Signed)
 Pt's husband called in stating the pt has left the house now 3 different times and he has had to go out and find her. He has been lucky so far and found her. This last time he had to get the police involved to find her.

## 2023-11-08 NOTE — Telephone Encounter (Signed)
 Patient is sleeping well, she declining in her health. Husband is going to call insurance for assistance in the home.

## 2023-11-11 ENCOUNTER — Emergency Department (HOSPITAL_COMMUNITY)
Admission: EM | Admit: 2023-11-11 | Discharge: 2023-11-12 | Discharge status: Against Medical Advice | Attending: Emergency Medicine | Admitting: Emergency Medicine

## 2023-11-11 ENCOUNTER — Encounter (HOSPITAL_COMMUNITY): Payer: Self-pay

## 2023-11-11 ENCOUNTER — Other Ambulatory Visit: Payer: Self-pay

## 2023-11-11 DIAGNOSIS — R4182 Altered mental status, unspecified: Secondary | ICD-10-CM | POA: Diagnosis not present

## 2023-11-11 DIAGNOSIS — R350 Frequency of micturition: Secondary | ICD-10-CM | POA: Insufficient documentation

## 2023-11-11 DIAGNOSIS — G309 Alzheimer's disease, unspecified: Secondary | ICD-10-CM | POA: Insufficient documentation

## 2023-11-11 DIAGNOSIS — Z5321 Procedure and treatment not carried out due to patient leaving prior to being seen by health care provider: Secondary | ICD-10-CM | POA: Diagnosis not present

## 2023-11-11 LAB — COMPREHENSIVE METABOLIC PANEL WITH GFR
ALT: 17 U/L (ref 0–44)
AST: 22 U/L (ref 15–41)
Albumin: 3.7 g/dL (ref 3.5–5.0)
Alkaline Phosphatase: 54 U/L (ref 38–126)
Anion gap: 7 (ref 5–15)
BUN: 10 mg/dL (ref 8–23)
CO2: 28 mmol/L (ref 22–32)
Calcium: 10 mg/dL (ref 8.9–10.3)
Chloride: 103 mmol/L (ref 98–111)
Creatinine, Ser: 1.08 mg/dL — ABNORMAL HIGH (ref 0.44–1.00)
GFR, Estimated: 54 mL/min — ABNORMAL LOW (ref >60)
Glucose, Bld: 107 mg/dL — ABNORMAL HIGH (ref 70–99)
Potassium: 4.3 mmol/L (ref 3.5–5.1)
Sodium: 138 mmol/L (ref 135–145)
Total Bilirubin: 0.6 mg/dL (ref 0.0–1.2)
Total Protein: 6.9 g/dL (ref 6.5–8.1)

## 2023-11-11 LAB — CBC WITH DIFFERENTIAL/PLATELET
Abs Immature Granulocytes: 0.03 10*3/uL (ref 0.00–0.07)
Basophils Absolute: 0 10*3/uL (ref 0.0–0.1)
Basophils Relative: 1 %
Eosinophils Absolute: 0 10*3/uL (ref 0.0–0.5)
Eosinophils Relative: 1 %
HCT: 46 % (ref 36.0–46.0)
Hemoglobin: 14.7 g/dL (ref 12.0–15.0)
Immature Granulocytes: 0 %
Lymphocytes Relative: 21 %
Lymphs Abs: 1.9 10*3/uL (ref 0.7–4.0)
MCH: 30.4 pg (ref 26.0–34.0)
MCHC: 32 g/dL (ref 30.0–36.0)
MCV: 95 fL (ref 80.0–100.0)
Monocytes Absolute: 0.7 10*3/uL (ref 0.1–1.0)
Monocytes Relative: 8 %
Neutro Abs: 6.1 10*3/uL (ref 1.7–7.7)
Neutrophils Relative %: 69 %
Platelets: 261 10*3/uL (ref 150–400)
RBC: 4.84 MIL/uL (ref 3.87–5.11)
RDW: 13.3 % (ref 11.5–15.5)
WBC: 8.8 10*3/uL (ref 4.0–10.5)
nRBC: 0 % (ref 0.0–0.2)

## 2023-11-11 NOTE — ED Notes (Addendum)
 Patient instructed to urinate in hat in toilet and place toilet paper in toilet bowl. Urine noted in toilet bowl, hat is dry, no toilet paper anywhere.

## 2023-11-11 NOTE — ED Provider Triage Note (Signed)
 Emergency Medicine Provider Triage Evaluation Note  Gina King , a 78 y.o. female  was evaluated in triage.  Pt complains of taking off without telling husband, lives at home. Husband provides history, concern for UTI, no history of same.   Review of Systems  Positive: Urinary frequency Negative: Fever, dysuria   Physical Exam  BP (!) 169/94 (BP Location: Right Arm)   Pulse 85   Temp 98.8 F (37.1 C)   Resp 18   SpO2 97%  Gen:   Awake, no distress   Resp:  Normal effort  MSK:   Moves extremities without difficulty  Other:  Alert to spouse, not to date, location   Medical Decision Making  Medically screening exam initiated at 7:28 PM.  Appropriate orders placed.  Gina King was informed that the remainder of the evaluation will be completed by another provider, this initial triage assessment does not replace that evaluation, and the importance of remaining in the ED until their evaluation is complete.     Darlis Eisenmenger, PA-C 11/11/23 1929

## 2023-11-11 NOTE — ED Triage Notes (Signed)
 Patient family is concerned for UTI since patient has been wandering lately into the woods and in the road. Diagnosed with Alzheimers.

## 2023-11-11 NOTE — ED Notes (Signed)
 Pt left out of ED with family member

## 2023-11-12 ENCOUNTER — Telehealth: Payer: Self-pay | Admitting: Internal Medicine

## 2023-11-12 NOTE — Telephone Encounter (Signed)
 Copied from CRM [id number]. Topic: Clinical - Lab/Test Results >> Nov 12, 2023 12:18 PM Marlan Silva wrote: Reason for CRM: Patients husband Royston Cornea called in to request the patients lab results. Patient can be reached at [phone].

## 2023-11-13 NOTE — Telephone Encounter (Signed)
 Patient is calling In regarding results from the urine test patient husband would like a call back regarding this

## 2023-11-13 NOTE — Telephone Encounter (Signed)
 I do not see an order for urine test. Im unsure as to what patient is asking about. Last labs were done at the hospital.

## 2023-11-14 ENCOUNTER — Ambulatory Visit: Admission: EM | Admit: 2023-11-14 | Discharge: 2023-11-14 | Disposition: A | Discharge status: Home / Self Care

## 2023-11-14 ENCOUNTER — Encounter: Payer: Self-pay | Admitting: Emergency Medicine

## 2023-11-14 DIAGNOSIS — R350 Frequency of micturition: Secondary | ICD-10-CM | POA: Diagnosis not present

## 2023-11-14 LAB — POCT URINALYSIS DIP (MANUAL ENTRY)
Bilirubin, UA: NEGATIVE
Glucose, UA: NEGATIVE mg/dL
Ketones, POC UA: NEGATIVE mg/dL
Leukocytes, UA: NEGATIVE
Nitrite, UA: NEGATIVE
Protein Ur, POC: NEGATIVE mg/dL
Spec Grav, UA: 1.015 (ref 1.010–1.025)
Urobilinogen, UA: 0.2 U/dL
pH, UA: 5.5 (ref 5.0–8.0)

## 2023-11-14 NOTE — ED Provider Notes (Signed)
 [facility]  Note:  This document was prepared using Conservation officer, historic buildings and may include unintentional dictation errors.  MRN: [id number] DOB: [birth year]  Subjective:   Gina King is a 78 y.o. female presenting for increased urinary frequency and x 5 to 6 days.  Patient's husband noticed that she seems to be going to the bathroom more frequently, no noted dysuria or abdominal pain.  Patient has moderate dementia and relies heavily on her husband for care.  Husband denies any other medical concerns but states that he went to make sure she did not have a UTI because of the increased urinary frequency.  Patient does drink a large amount of water daily which may be contributing.  No current facility-administered medications for this encounter.  Current Outpatient Medications:    cholecalciferol (VITAMIN D ) 1000 UNITS tablet, Take 1,000 Units by mouth daily., Disp: , Rfl:    donepezil  (ARICEPT ) 10 MG tablet, TAKE  1 TABLET EVERY DAY, Disp: 90 tablet, Rfl: 3   memantine  (NAMENDA ) 10 MG tablet, Take 1 tablet (10 mg total) by mouth 2 (two) times daily., Disp: 180 tablet, Rfl: 3   Multiple Vitamin (MULTIVITAMIN) tablet, Take 1 tablet by mouth daily., Disp: , Rfl:    pantoprazole  (PROTONIX ) 20 MG tablet, TAKE 1 TABLET EVERY DAY, Disp: 90 tablet, Rfl: 0   rosuvastatin  (CRESTOR ) 20 MG tablet, TAKE 1 TABLET EVERY DAY, Disp: 90 tablet, Rfl: 0   vitamin B-12 (CYANOCOBALAMIN ) 1000 MCG tablet, Take 1,000 mcg by mouth daily. Reported on 11/07/2015, Disp: , Rfl:    vitamin C (ASCORBIC ACID) 500 MG tablet, Take 500 mg by mouth 2 (two) times daily., Disp: , Rfl:    Allergies  Allergen Reactions   Gabapentin     REACTION: withdrawal symtoms when didnt wean    Past Medical History:  Diagnosis Date   CVA (cerebral vascular accident)    07/2021 MRI - Moderate chronic microvascular ischemic changes with a few superimposed chronic small vessel infarcts   GERD (gastroesophageal reflux  disease) 12/30/2006   Mixed hyperlipidemia 07/28/2007   Moderate dementia, unclear etiology 08/25/2021   Numbness and tingling of left leg 11/30/2020   Osteopenia 12/30/2006   Restless leg syndrome 06/23/2021   Vitamin D  deficiency 07/28/2007     Past Surgical History:  Procedure Laterality Date   ABDOMINAL HYSTERECTOMY  1984    Family History  Problem Relation Age of Onset   Memory loss Father        with advanced age and other medical complications   Memory loss Brother        concerns for dementia presentation   Kidney failure Brother    Colon cancer Neg Hx     Social History   Tobacco Use   Smoking status: Never    Passive exposure: Never   Smokeless tobacco: Never  Vaping Use   Vaping status: Never Used  Substance Use Topics   Alcohol use: No   Drug use: No    ROS Refer to HPI for ROS details.  Objective:   Vitals: BP (!) 165/91 (BP Location: Left Arm)   Pulse 95   Temp 98.1 F (36.7 C) (Oral)   Resp 18   Wt 117 lb 15.1 oz (53.5 kg)   SpO2 97%   BMI 20.25 kg/m   Physical Exam Vitals and nursing note reviewed.  Constitutional:      General: She is not in acute distress.    Appearance: Normal appearance. She is well-developed.  She is not ill-appearing or toxic-appearing.  HENT:     Head: Normocephalic and atraumatic.   Cardiovascular:     Rate and Rhythm: Normal rate.  Pulmonary:     Effort: Pulmonary effort is normal. No respiratory distress.  Abdominal:     General: There is no distension.     Tenderness: There is no abdominal tenderness. There is no right CVA tenderness, left CVA tenderness, guarding or rebound.   Skin:    General: Skin is warm and dry.   Neurological:     General: No focal deficit present.     Mental Status: She is alert and oriented to person, place, and time.   Psychiatric:        Mood and Affect: Mood normal.        Behavior: Behavior normal.     Procedures  Results for orders placed or performed during the  hospital encounter of 11/14/23 (from the past 24 hours)  POCT urinalysis dipstick     Status: Abnormal   Collection Time: 11/14/23  2:00 PM  Result Value Ref Range   Color, UA other (A) yellow   Clarity, UA clear clear   Glucose, UA negative negative mg/dL   Bilirubin, UA negative negative   Ketones, POC UA negative negative mg/dL   Spec Grav, UA 4.098 1.191 - 1.025   Blood, UA trace-intact (A) negative   pH, UA 5.5 5.0 - 8.0   Protein Ur, POC negative negative mg/dL   Urobilinogen, UA 0.2 0.2 or 1.0 E.U./dL   Nitrite, UA Negative Negative   Leukocytes, UA Negative Negative    No results found.   Assessment and Plan :     Discharge Instructions       1. Increased urinary frequency (Primary) - POCT urinalysis dipstick completed in UC shows no leukocytes, no nitrite, trace blood, these findings are not typically indicative of urinary tract infection. - Urine Culture collected and sent to lab for further testing results should be available in 2 to 3 days if there is any abnormality noted on the final report we will contact you and prescribe appropriate treatment. - Please continue to monitor symptoms for any change in severity or any development of new symptoms such as abdominal pain, dysuria, incontinence, flank pain, or fever if you experience any escalation of symptoms follow-up for further evaluation and treatment.       Sulema Braid B Chea Malan   Rossi Burdo, [place] B, [place] 11/14/23 1419

## 2023-11-14 NOTE — ED Triage Notes (Signed)
 Pt presents c/o frequent urination x 6 days. Pt denies abdominal pain.

## 2023-11-14 NOTE — Discharge Instructions (Addendum)
  1. Increased urinary frequency (Primary) - POCT urinalysis dipstick completed in UC shows no leukocytes, no nitrite, trace blood, these findings are not typically indicative of urinary tract infection. - Urine Culture collected and sent to lab for further testing results should be available in 2 to 3 days if there is any abnormality noted on the final report we will contact you and prescribe appropriate treatment. - Please continue to monitor symptoms for any change in severity or any development of new symptoms such as abdominal pain, dysuria, incontinence, flank pain, or fever if you experience any escalation of symptoms follow-up for further evaluation and treatment.

## 2023-11-15 ENCOUNTER — Telehealth: Payer: Self-pay | Admitting: Emergency Medicine

## 2023-11-15 LAB — URINE CULTURE
Culture: NO GROWTH
Special Requests: NORMAL

## 2023-11-15 NOTE — Telephone Encounter (Signed)
 Returned call to patient and spoke with her and spouse regarding urine culture. Let them know they had not received a phone call due to urine culture being normal with no bacterial growth.

## 2023-11-15 NOTE — Telephone Encounter (Signed)
 Pt left message with patient access asking about urine culture results that was sent out.

## 2023-11-18 ENCOUNTER — Ambulatory Visit (HOSPITAL_COMMUNITY): Payer: Self-pay

## 2024-01-31 IMAGING — XA DG SPINAL PUNCT LUMBAR DIAG WITH FL CT GUIDANCE
2 series · 2 of 2 positions shown · non-contrast
Comparison: None

CLINICAL DATA: Memory loss

EXAM:
DIAGNOSTIC LUMBAR PUNCTURE UNDER FLUOROSCOPIC GUIDANCE

[Series 2: ortho standard · 1 of 1 slices shown (1 of 2)]
[im 1/1]
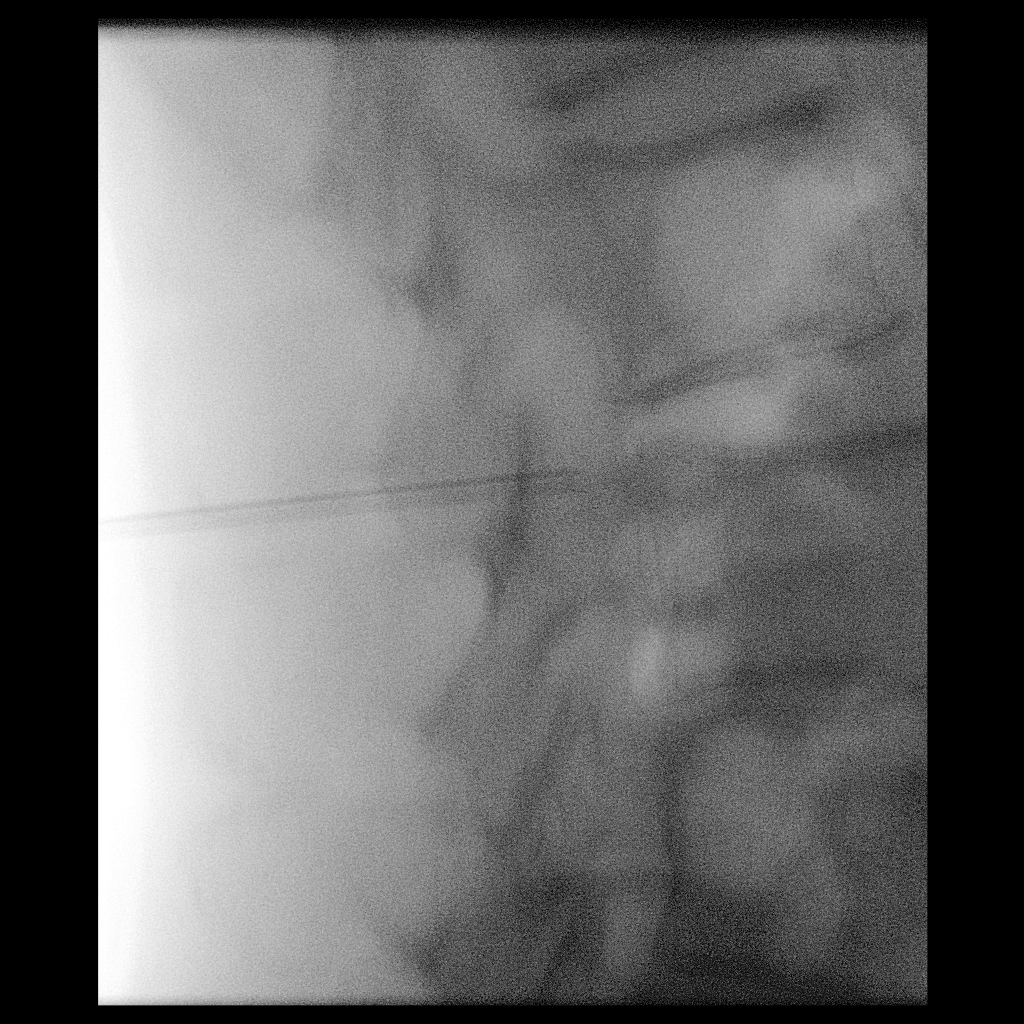

[Series 3: ortho standard · 1 of 1 slices shown (2 of 2)]
[im 1/1]
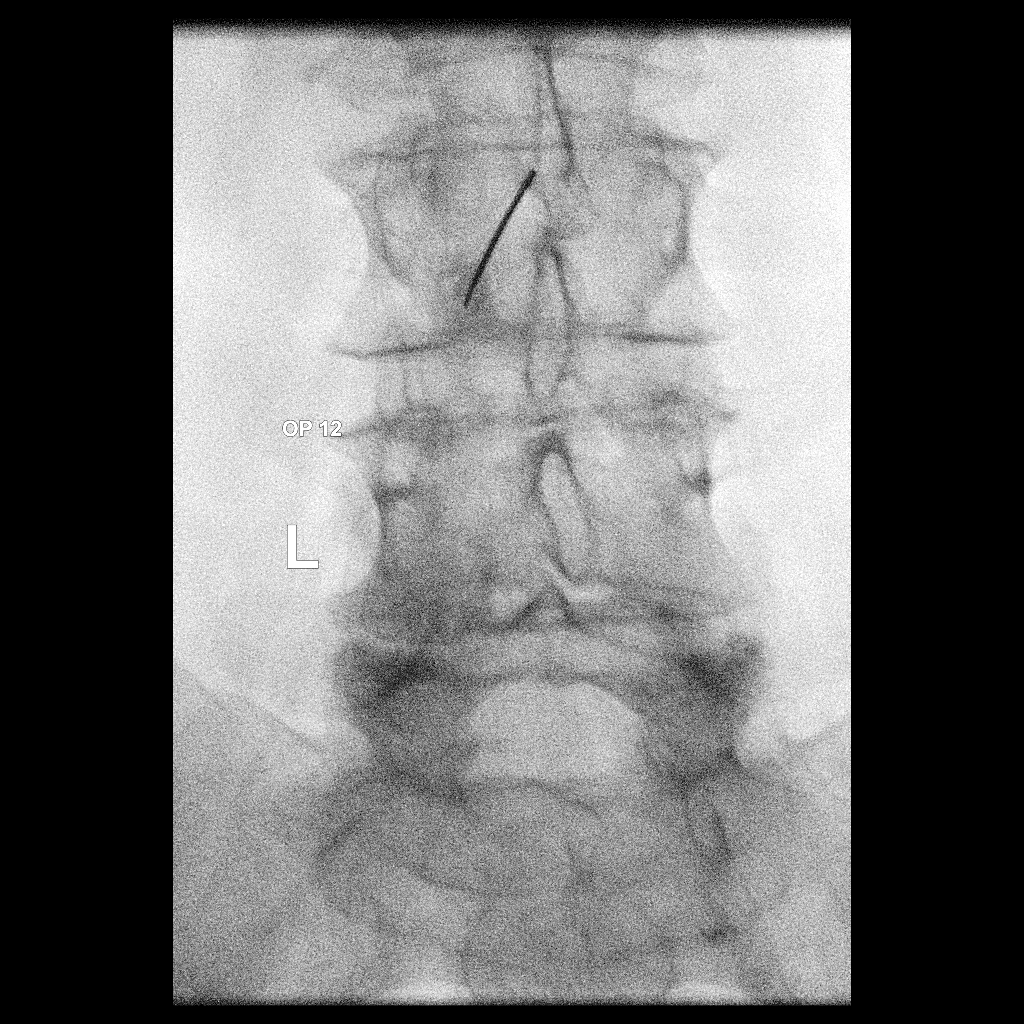

[2 of 2 positions shown; findings below may reference images not displayed]

FLUOROSCOPY:
Radiation Exposure Index (as provided by the fluoroscopic device): 1
mGy Kerma

PROCEDURE:
Informed consent was obtained from the patient prior to the
procedure, including potential complications of headache, allergy,
and pain. With the patient prone, the lower back was prepped with
Betadine. 1% Lidocaine was used for local anesthesia. Lumbar
puncture was performed at the L2-L3 level using a 20 gauge needle
with return of clear CSF with an opening pressure of 12 cm water. 28
ml of CSF were obtained for laboratory studies. The patient
tolerated the procedure well and there were no apparent
complications.
IMPRESSION: Diagnostic lumbar puncture as above.

## 2024-02-21 ENCOUNTER — Ambulatory Visit (INDEPENDENT_AMBULATORY_CARE_PROVIDER_SITE_OTHER): Admitting: Physician Assistant

## 2024-02-21 ENCOUNTER — Encounter: Payer: Self-pay | Admitting: Physician Assistant

## 2024-02-21 VITALS — BP 166/81 | HR 94 | Resp 20 | Wt 126.0 lb

## 2024-02-21 DIAGNOSIS — F03B0 Unspecified dementia, moderate, without behavioral disturbance, psychotic disturbance, mood disturbance, and anxiety: Secondary | ICD-10-CM | POA: Diagnosis not present

## 2024-02-21 MED ORDER — DONEPEZIL HCL 10 MG PO TABS: ORAL_TABLET | ORAL | 3 refills | Status: AC

## 2024-02-21 MED ORDER — MEMANTINE HCL 10 MG PO TABS: 10.0000 mg | ORAL_TABLET | Freq: Two times a day (BID) | ORAL | 3 refills | Status: DC

## 2024-02-21 NOTE — Progress Notes (Signed)
 Assessment/Plan:   Dementia likely due to Alzheimer's disease without behavioral disturbance   Gina King is a very pleasant 78 y.o. RH female with a history of vitamin D  deficiency, GERD, hyperlipidemia, RLS, history of stroke in 2023, hypertension, and a diagnosis of Alzheimer's disease without behavioral disturbance seen today in follow up for memory loss. Patient is currently on donepezil  10 mg daily and memantine  10 mg nightly (unable to tolerate twice daily) .  Patient is able to participate on ADLs, no longer drives.  Mood is stable     Follow up in  6 months. Continue donepezil  10 mg daily and memantine  10 mg nightly, side effects discussed  Recommend good control of her cardiovascular risk factors Continue to control mood as per PCP     Subjective:    This patient is accompanied in the office by her husband who supplements the history.  Previous records as well as any outside records available were reviewed prior to todays visit. Patient was last seen on 08/21/2023, unable to do MMSE due to cognitive decline    Any changes in memory since last visit?  Some good about days, worse in the afternoon  Husband reports that her memory may be worse. STM is worse than long-term memory.   She continues to read not as much as before.  She likes watching Westerns  and she like to participate, interacts with the show. Does not like to participate on activities outside of the house. repeats oneself?  Endorsed Disoriented when walking into a room? Denies    Leaving objects?  May misplace things and found in different places in places that the should not be.    Wandering behavior?  denies  She got into the habit of walking on her own and husband became concerned. HE is going to purchase a tile for safety. Any personality changes since last visit?  Denies.   Any worsening depression?:  Denies.   Hallucinations or paranoia?  Denies.   Seizures? denies    Any sleep changes?  Sleeps  well. Denies vivid dreams, REM behavior or sleepwalking   Sleep apnea?   Denies.   Any hygiene concerns? Denies.  Independent of bathing and dressing?  Endorsed  Does the patient needs help with medications?  Husband is in charge   Who is in charge of the finances?  Husband is in charge     Any changes in appetite?  She eats well, put on 10-12 lbs since last visit . Drinks plenty water.  Loves hot chocolate and ice cream.    Patient have trouble swallowing? Denies.   Does the patient cook? No, husband does the cooking Any headaches?   Denies.   Any vision changes? Denies Chronic back pain  denies   Ambulates with difficulty? Denies.  She walks with her husband around [place], [place] .  She is very active  Recent falls or head injuries? Denies.     Unilateral weakness, numbness or tingling? Denies.   Any tremors?  Denies   Any anosmia?  Denies   Any incontinence of urine?  Endorsed   Any bowel dysfunction?   Denies      Patient lives with her husband  Does the patient drive? No longer drives    Initial visit 09/12/2021 Gina King is a very pleasant 78 y.o. RH female  seen today in follow up for memory loss. This patient is accompanied in the office by her husband who supplements the history.  Previous  records as well as any outside records available were reviewed prior to todays visit.  Patient underwent neurocognitive testing yielding a diagnosis of moderate dementia without behavioral disturbance.  She has been experiencing memory issues for the last 4 or 5 months.  Since that time, she the patient repeats herself, has begun to be more argumentative she hates to be wrong .  She admits to mood changes, but denies hallucinations or paranoia.  Her husband and her sister-in-law are the main caretakers.  There are no hygiene concerns.  Her medications are in a pillbox, her husband monitors them.  He is also in charge of the finances at this time.  Her appetite is good, denies trouble  swallowing.  She does not cook.  She enjoys doing work finding, crossword puzzles, and until 5 years ago, she was a Science writer trouble swallowing.  She does not cook.  She ambulates without difficulty, denies any falls or head injuries.  She continues to drive without getting lost.  She denies any headache, double vision, dizziness, focal numbness or tingling, unilateral weakness, tremors or anosmia.  She has restless leg syndrome.  No history of seizures, denies urine incontinence, retention, constipation or diarrhea.     MRI of the brain 07/16/2021, personally reviewed, was remarkable for generalized parenchymal volume loss, moderate chronic microvascular ischemic changes likely with few superimposed chronic small vessel infarcts.     Dementia panel LP sent to [facility] on 09/18/2021, is positive for 14 3 protein elevated at 5725.  Other results consistent with Alzheimer's disease.  PREVIOUS MEDICATIONS:   CURRENT MEDICATIONS:  Outpatient Encounter Medications as of 02/21/2024  Medication Sig   cholecalciferol (VITAMIN D ) 1000 UNITS tablet Take 1,000 Units by mouth daily.   donepezil  (ARICEPT ) 10 MG tablet TAKE  1 TABLET EVERY DAY   memantine  (NAMENDA ) 10 MG tablet Take 1 tablet (10 mg total) by mouth 2 (two) times daily.   Multiple Vitamin (MULTIVITAMIN) tablet Take 1 tablet by mouth daily.   pantoprazole  (PROTONIX ) 20 MG tablet TAKE 1 TABLET EVERY DAY   rosuvastatin  (CRESTOR ) 20 MG tablet TAKE 1 TABLET EVERY DAY   vitamin B-12 (CYANOCOBALAMIN ) 1000 MCG tablet Take 1,000 mcg by mouth daily. Reported on 11/07/2015   vitamin C (ASCORBIC ACID) 500 MG tablet Take 500 mg by mouth 2 (two) times daily.   No facility-administered encounter medications on file as of 02/21/2024.       10/08/2022   11:39 AM  MMSE - Mini Mental State Exam  Not completed: Unable to complete       No data to display          Objective:     PHYSICAL EXAMINATION:    VITALS:   Vitals:   02/21/24 1258   BP: (!) 166/81  Pulse: 94  Resp: 20  SpO2: 97%  Weight: 126 lb (57.2 kg)    GEN:  The patient appears stated age and is in NAD. HEENT:  Normocephalic, atraumatic.   Neurological examination:  General: NAD, well-groomed, appears stated age. Orientation: The patient is alert. Oriented to person, not to place and date Cranial nerves: There is good facial symmetry.The speech is fluent and clear. No aphasia or dysarthria. Fund of knowledge is reduced. Recent and remote memory are impaired. Attention and concentration are reduced. Able to name objects and unable to repeat phrases.  Hearing is intact to conversational tone.   Sensation: Sensation is intact to light touch throughout Motor: Strength is at least antigravity x4. DTR's  2/4 in UE/LE     Movement examination: Tone: There is normal tone in the UE/LE Abnormal movements:  no tremor.  No myoclonus.  No asterixis.   Coordination:  There is no decremation with RAM's. Normal finger to nose  Gait and Station: The patient has no difficulty arising out of a deep-seated chair without the use of the hands. The patient's stride length is good.  Gait is cautious and narrow.    Thank you for allowing us  the opportunity to participate in the care of this nice patient. Please do not hesitate to contact us  for any questions or concerns.   Total time spent on today's visit was 32 minutes dedicated to this patient today, preparing to see patient, examining the patient, ordering tests and/or medications and counseling the patient, documenting clinical information in the EHR or other health record, independently interpreting results and communicating results to the patient/family, discussing treatment and goals, answering patient's questions and coordinating care.  Cc:  Rollene Almarie LABOR, MD  Camie Sevin 02/21/2024 1:08 PM

## 2024-02-21 NOTE — Patient Instructions (Addendum)
 It was a pleasure to see you today at our office.   Recommendations:  Meds: Follow up in 6 months Continue donepezil  10 mg daily Memantine  10 mg at night    Visit the website in You Tube :Dementia Success Path   RECOMMENDATIONS FOR ALL PATIENTS WITH MEMORY PROBLEMS: 1. Continue to exercise (Recommend 30 minutes of walking everyday, or 3 hours every week) 2. Increase social interactions - continue going to [place] and enjoy social gatherings with friends and family 3. Eat healthy, avoid fried foods and eat more fruits and vegetables 4. Maintain adequate blood pressure, blood sugar, and blood cholesterol level. Reducing the risk of stroke and cardiovascular disease also helps promoting better memory. 5. Avoid stressful situations. Live a simple life and avoid aggravations. Organize your time and prepare for the next day in anticipation. 6. Sleep well, avoid any interruptions of sleep and avoid any distractions in the bedroom that may interfere with adequate sleep quality 7. Avoid sugar, avoid sweets as there is a strong link between excessive sugar intake, diabetes, and cognitive impairment We discussed the Mediterranean diet, which has been shown to help patients reduce the risk of progressive memory disorders and reduces cardiovascular risk. This includes eating fish, eat fruits and green leafy vegetables, nuts like almonds and hazelnuts, walnuts, and also use olive oil. Avoid fast foods and fried foods as much as possible. Avoid sweets and sugar as sugar use has been linked to worsening of memory function.  There is always a concern of gradual progression of memory problems. If this is the case, then we may need to adjust level of care according to patient needs. Support, both to the patient and caregiver, should then be put into place.       FALL PRECAUTIONS: Be cautious when walking. Scan the area for obstacles that may increase the risk of trips and falls. When getting up in the  mornings, sit up at the edge of the bed for a few minutes before getting out of bed. Consider elevating the bed at the head end to avoid drop of blood pressure when getting up. Walk always in a well-lit room (use night lights in the walls). Avoid area rugs or power cords from appliances in the middle of the walkways. Use a walker or a cane if necessary and consider physical therapy for balance exercise. Get your eyesight checked regularly.  FINANCIAL OVERSIGHT: Supervision, especially oversight when making financial decisions or transactions is also recommended.  HOME SAFETY: Consider the safety of the kitchen when operating appliances like stoves, microwave oven, and blender. Consider having supervision and share cooking responsibilities until no longer able to participate in those. Accidents with firearms and other hazards in the house should be identified and addressed as well.   ABILITY TO BE LEFT ALONE: If patient is unable to contact 911 operator, consider using LifeLine, or when the need is there, arrange for someone to stay with patients. Smoking is a fire hazard, consider supervision or cessation. Risk of wandering should be assessed by caregiver and if detected at any point, supervision and safe proof recommendations should be instituted.  MEDICATION SUPERVISION: Inability to self-administer medication needs to be constantly addressed. Implement a mechanism to ensure safe administration of the medications.   DRIVING: Regarding driving, in patients with progressive memory problems, driving will be impaired. We advise to have someone else do the driving if trouble finding directions or if minor accidents are reported. Independent driving assessment is available to determine safety of driving.  If you are interested in the driving assessment, you can contact the following:  [employer] in [place] [phone]  [facility] [phone]  [facility] [phone] or [phone]      Mediterranean Diet A Mediterranean diet refers to food and lifestyle choices that are based on the traditions of countries located on the [employer]. This way of eating has been shown to help prevent certain conditions and improve outcomes for people who have chronic diseases, like kidney disease and heart disease. What are tips for following this plan? Lifestyle  Cook and eat meals together with your family, when possible. Drink enough fluid to keep your urine clear or pale yellow. Be physically active every day. This includes: Aerobic exercise like running or swimming. Leisure activities like gardening, walking, or housework. Get 7-8 hours of sleep each night. If recommended by your health care provider, drink red wine in moderation. This means 1 glass a day for nonpregnant women and 2 glasses a day for men. A glass of wine equals 5 oz (150 mL). Reading food labels  Check the serving size of packaged foods. For foods such as rice and pasta, the serving size refers to the amount of cooked product, not dry. Check the total fat in packaged foods. Avoid foods that have saturated fat or trans fats. Check the ingredients list for added sugars, such as corn syrup. Shopping  At the grocery store, buy most of your food from the areas near the walls of the store. This includes: Fresh fruits and vegetables (produce). Grains, beans, nuts, and seeds. Some of these may be available in unpackaged forms or large amounts (in bulk). Fresh seafood. Poultry and eggs. Low-fat dairy products. Buy whole ingredients instead of prepackaged foods. Buy fresh fruits and vegetables in-season from local farmers markets. Buy frozen fruits and vegetables in resealable bags. If you do not have access to quality fresh seafood, buy precooked frozen shrimp or canned fish, such as tuna, salmon, or sardines. Buy small amounts of raw or cooked  vegetables, salads, or olives from the deli or salad bar at your store. Stock your pantry so you always have certain foods on hand, such as olive oil, canned tuna, canned tomatoes, rice, pasta, and beans. Cooking  Cook foods with extra-virgin olive oil instead of using butter or other vegetable oils. Have meat as a side dish, and have vegetables or grains as your main dish. This means having meat in small portions or adding small amounts of meat to foods like pasta or stew. Use beans or vegetables instead of meat in common dishes like chili or lasagna. Experiment with different cooking methods. Try roasting or broiling vegetables instead of steaming or sauteing them. Add frozen vegetables to soups, stews, pasta, or rice. Add nuts or seeds for added healthy fat at each meal. You can add these to yogurt, salads, or vegetable dishes. Marinate fish or vegetables using olive oil, lemon juice, garlic, and fresh herbs. Meal planning  Plan to eat 1 vegetarian meal one day each week. Try to work up to 2 vegetarian meals, if possible. Eat seafood 2 or more times a week. Have healthy snacks readily available, such as: Vegetable sticks with hummus. Greek yogurt. Fruit and nut trail mix. Eat balanced meals throughout the week. This includes: Fruit: 2-3 servings a day Vegetables: 4-5 servings a day Low-fat dairy: 2 servings a day Fish, poultry, or lean meat: 1 serving a day Beans and legumes: 2  or more servings a week Nuts and seeds: 1-2 servings a day Whole grains: 6-8 servings a day Extra-virgin olive oil: 3-4 servings a day Limit red meat and sweets to only a few servings a month What are my food choices? Mediterranean diet Recommended Grains: Whole-grain pasta. Brown rice. Bulgar wheat. Polenta. Couscous. Whole-wheat bread. [place] Made[place]. Vegetables: Artichokes. Beets. Broccoli. Cabbage. Carrots. Eggplant. Green beans. Chard. Kale. Spinach. Onions. Leeks. Peas. Squash. Tomatoes. Peppers.  Radishes. Fruits: Apples. Apricots. Avocado. Berries. Bananas. Cherries. Dates. Figs. Grapes. Lemons. Melon. Oranges. Peaches. Plums. Pomegranate. Meats and other protein foods: Beans. Almonds. Sunflower seeds. Pine nuts. Peanuts. Cod. Salmon. Scallops. Shrimp. Tuna. Tilapia. Clams. Oysters. Eggs. Dairy: Low-fat milk. Cheese. Greek yogurt. Beverages: Water. Red wine. Herbal tea. Fats and oils: Extra virgin olive oil. Avocado oil. Grape seed oil. Sweets and desserts: [place] yogurt with honey. Baked apples. Poached pears. Trail mix. Seasoning and other foods: Basil. Cilantro. Coriander. Cumin. Mint. Parsley. Sage. Rosemary. Tarragon. Garlic. Oregano. Thyme. Pepper. Balsalmic vinegar. Tahini. Hummus. Tomato sauce. Olives. Mushrooms. Limit these Grains: Prepackaged pasta or rice dishes. Prepackaged cereal with added sugar. Vegetables: Deep fried potatoes (french fries). Fruits: Fruit canned in syrup. Meats and other protein foods: Beef. Pork. Lamb. Poultry with skin. Hot dogs. [place]. Dairy: Ice cream. Sour cream. Whole milk. Beverages: Juice. Sugar-sweetened soft drinks. Beer. Liquor and spirits. Fats and oils: Butter. Canola oil. Vegetable oil. Beef fat (tallow). Lard. Sweets and desserts: Cookies. Cakes. Pies. Candy. Seasoning and other foods: Mayonnaise. Premade sauces and marinades. The items listed may not be a complete list. Talk with your dietitian about what dietary choices are right for you. Summary The Mediterranean diet includes both food and lifestyle choices. Eat a variety of fresh fruits and vegetables, beans, nuts, seeds, and whole grains. Limit the amount of red meat and sweets that you eat. Talk with your health care provider about whether it is safe for you to drink red wine in moderation. This means 1 glass a day for nonpregnant women and 2 glasses a day for men. A glass of wine equals 5 oz (150 mL). This information is not intended to replace advice given to you by your health  care provider. Make sure you discuss any questions you have with your health care provider. Document Released: 01/12/2016 Document Revised: 02/14/2016 Document Reviewed: 01/12/2016 Elsevier Interactive Patient Education  2017 [employer].  Your provider has requested that you have labwork completed today. Please go to [facility] ([id number]) on the second floor of this building before leaving the office today. You do not need to check in. If you are not called within 15 minutes please check with the front desk.   We have sent a referral to [facility] for your Lumbar PunctureI and they will call you directly to schedule your appointment. They are located at [address] [facility]. If you need to contact them directly please call [phone].

## 2024-02-23 MED ORDER — MEMANTINE HCL 10 MG PO TABS: 10.0000 mg | ORAL_TABLET | Freq: Every evening | ORAL | 3 refills | Status: AC

## 2024-04-09 DIAGNOSIS — G2581 Restless legs syndrome: Secondary | ICD-10-CM | POA: Diagnosis not present

## 2024-04-09 DIAGNOSIS — G309 Alzheimer's disease, unspecified: Secondary | ICD-10-CM | POA: Diagnosis not present

## 2024-04-09 DIAGNOSIS — F02A0 Dementia in other diseases classified elsewhere, mild, without behavioral disturbance, psychotic disturbance, mood disturbance, and anxiety: Secondary | ICD-10-CM | POA: Diagnosis not present

## 2024-04-09 DIAGNOSIS — H269 Unspecified cataract: Secondary | ICD-10-CM | POA: Diagnosis not present

## 2024-04-09 DIAGNOSIS — E785 Hyperlipidemia, unspecified: Secondary | ICD-10-CM | POA: Diagnosis not present

## 2024-04-09 DIAGNOSIS — Z7982 Long term (current) use of aspirin: Secondary | ICD-10-CM | POA: Diagnosis not present

## 2024-04-09 DIAGNOSIS — I10 Essential (primary) hypertension: Secondary | ICD-10-CM | POA: Diagnosis not present

## 2024-04-09 DIAGNOSIS — K219 Gastro-esophageal reflux disease without esophagitis: Secondary | ICD-10-CM | POA: Diagnosis not present

## 2024-04-09 DIAGNOSIS — I4891 Unspecified atrial fibrillation: Secondary | ICD-10-CM | POA: Diagnosis not present

## 2024-05-04 ENCOUNTER — Ambulatory Visit: Admission: EM | Admit: 2024-05-04 | Discharge: 2024-05-04 | Disposition: A | Discharge status: Home / Self Care

## 2024-05-04 DIAGNOSIS — R21 Rash and other nonspecific skin eruption: Secondary | ICD-10-CM

## 2024-05-04 MED ORDER — CETIRIZINE HCL 10 MG PO TABS: 10.0000 mg | ORAL_TABLET | Freq: Every day | ORAL | 0 refills | Status: DC

## 2024-05-04 MED ORDER — TRIAMCINOLONE ACETONIDE 0.1 % EX CREA: 1.0000 | TOPICAL_CREAM | Freq: Two times a day (BID) | CUTANEOUS | 0 refills | Status: AC

## 2024-05-04 MED ORDER — TRIAMCINOLONE ACETONIDE 0.1 % EX CREA: 1.0000 | TOPICAL_CREAM | Freq: Two times a day (BID) | CUTANEOUS | 0 refills | Status: DC

## 2024-05-04 NOTE — ED Triage Notes (Signed)
 Patient here with spouse for a rash on her back. Of note; spouse reports she has memory issues.

## 2024-05-04 NOTE — ED Provider Notes (Signed)
 [facility]    CSN: [id number] Arrival date & time: 05/04/24  1335      History   Chief Complaint Chief Complaint  Patient presents with   Rash    HPI Gina King is a 78 y.o. female.   Patient brought into clinic by husband over concern of a rash to the back that he noticed 3 days ago.  The rash is not itchy or painful.  Feels like the rash has spread around her torso on both sides.  Has been is concerned that maybe it is due to bedsores.  Changed laundry detergent around 8 or 9 months ago.  Has not changed soaps recently.  Patient difficult historian due to memory impairment.  The history is provided by the patient, medical records and the spouse.  Rash   Past Medical History:  Diagnosis Date   CVA (cerebral vascular accident)    07/2021 MRI - Moderate chronic microvascular ischemic changes with a few superimposed chronic small vessel infarcts   GERD (gastroesophageal reflux disease) 12/30/2006   Mixed hyperlipidemia 07/28/2007   Moderate dementia, unclear etiology 08/25/2021   Numbness and tingling of left leg 11/30/2020   Osteopenia 12/30/2006   Restless leg syndrome 06/23/2021   Vitamin D  deficiency 07/28/2007    Patient Active Problem List   Diagnosis Date Noted   Essential hypertension 03/24/2022   Moderate dementia without behavioral disturbance, psychotic disturbance, mood disturbance, or anxiety (HCC) 08/25/2021   Hx of completed stroke 07/28/2021   Encounter for general adult medical examination with abnormal findings 02/22/2014   Vitamin D  deficiency 07/28/2007   Mixed hyperlipidemia 07/28/2007   GERD (gastroesophageal reflux disease) 12/30/2006   Osteopenia 12/30/2006    Past Surgical History:  Procedure Laterality Date   ABDOMINAL HYSTERECTOMY  1984    OB History   No obstetric history on file.      Home Medications    Prior to Admission medications   Medication Sig Start Date End Date Taking? Authorizing Provider  FLUAD  0.5 ML injection Inject 0.5 mLs into the muscle once. 02/24/24  Yes [provider]  cetirizine (ZYRTEC) 10 MG tablet Take 1 tablet (10 mg total) by mouth daily. 05/04/24   Dreama, Aimy Sweeting  N, FNP  cholecalciferol (VITAMIN D ) 1000 UNITS tablet Take 1,000 Units by mouth daily.    [provider]  donepezil  (ARICEPT ) 10 MG tablet TAKE  1 TABLET EVERY DAY 02/21/24   Dina, Sara E, PA-C  memantine  (NAMENDA ) 10 MG tablet Take 1 tablet (10 mg total) by mouth at bedtime. 02/23/24   Wertman, Sara E, PA-C  Multiple Vitamin (MULTIVITAMIN) tablet Take 1 tablet by mouth daily.    [provider]  pantoprazole  (PROTONIX ) 20 MG tablet TAKE 1 TABLET EVERY DAY 05/30/23   Rollene Almarie LABOR, MD  rosuvastatin  (CRESTOR ) 20 MG tablet TAKE 1 TABLET EVERY DAY 06/17/23   Rollene Almarie LABOR, MD  triamcinolone  cream (KENALOG ) 0.1 % Apply 1 Application topically 2 (two) times daily. 05/04/24   Dreama, Bryan Omura  N, FNP  vitamin B-12 (CYANOCOBALAMIN ) 1000 MCG tablet Take 1,000 mcg by mouth daily. Reported on 11/07/2015    [provider]  vitamin C (ASCORBIC ACID) 500 MG tablet Take 500 mg by mouth 2 (two) times daily.    [provider]    Family History Family History  Problem Relation Age of Onset   Memory loss Father        with advanced age and other medical complications   Memory loss  Brother        concerns for dementia presentation   Kidney failure Brother    Colon cancer Neg Hx     Social History Social History   Tobacco Use   Smoking status: Never    Passive exposure: Never   Smokeless tobacco: Never  Vaping Use   Vaping status: Never Used  Substance Use Topics   Alcohol use: No   Drug use: No     Allergies   Gabapentin   Review of Systems Review of Systems  Per HPI  Physical Exam Triage Vital Signs ED Triage Vitals  Encounter Vitals Group     BP 05/04/24 1448 (!) 169/90     Girls Systolic BP Percentile --      Girls Diastolic BP  Percentile --      Boys Systolic BP Percentile --      Boys Diastolic BP Percentile --      Pulse Rate 05/04/24 1448 88     Resp 05/04/24 1448 18     Temp 05/04/24 1448 97.9 F (36.6 C)     Temp Source 05/04/24 1448 Temporal     SpO2 05/04/24 1448 97 %     Weight 05/04/24 1447 126 lb 1.7 oz (57.2 kg)     Height 05/04/24 1447 5' 4 (1.626 m)     Head Circumference --      Peak Flow --      Pain Score --      Pain Loc --      Pain Education --      Exclude from Growth Chart --    No data found.  Updated Vital Signs BP (!) 169/90 (BP Location: Left Arm)   Pulse 88   Temp 97.9 F (36.6 C) (Temporal)   Resp 18   Ht 5' 4 (1.626 m)   Wt 126 lb 1.7 oz (57.2 kg)   SpO2 97%   BMI 21.65 kg/m   Visual Acuity Right Eye Distance:   Left Eye Distance:   Bilateral Distance:    Right Eye Near:   Left Eye Near:    Bilateral Near:     Physical Exam Vitals and nursing note reviewed.  Constitutional:      Appearance: Normal appearance.  HENT:     Head: Normocephalic and atraumatic.     Right Ear: External ear normal.     Left Ear: External ear normal.     Nose: Nose normal.     Mouth/Throat:     Mouth: Mucous membranes are moist.  Eyes:     Conjunctiva/sclera: Conjunctivae normal.  Cardiovascular:     Rate and Rhythm: Normal rate.  Pulmonary:     Effort: Pulmonary effort is normal. No respiratory distress.  Skin:    General: Skin is warm and dry.     Findings: Rash present. Rash is not urticarial.         Comments: Scattered erythematous patches, largest at the base of her spine.  Without urticaria.  Nonblanchable.  Neurological:     Mental Status: She is alert. Mental status is at baseline.  Psychiatric:        Mood and Affect: Mood normal.      UC Treatments / Results  Labs (all labs ordered are listed, but only abnormal results are displayed) Labs Reviewed - No data to display  EKG   Radiology No results found.  Procedures Procedures (including  critical care time)  Medications Ordered in UC Medications - No data  to display  Initial Impression / Assessment and Plan / UC Course  I have reviewed the triage vital signs and the nursing notes.  Pertinent labs & imaging results that were available during my care of the patient were reviewed by me and considered in my medical decision making (see chart for details).  Vitals in triage reviewed, patient is hemodynamically stable.  Erythematous patches that are nonblanchable.  Patient denies urticaria, difficult historian due to memory impairment.  Does not appear to be bedbugs or scabies.  Difficult to determine what is causing the rash.  Will trial topical steroid cream and encourage PCP follow-up.  Encouraged washing sheets, linens and clothing in case there is an irritant.  Rash is what localized and without urticaria, will avoid oral steroids.  Plan of care, follow-up care return precautions given, no questions at this time.    Final Clinical Impressions(s) / UC Diagnoses   Final diagnoses:  Rash and nonspecific skin eruption     Discharge Instructions      I suggest mixing the steroid cream with Aquaphor and applying this to the rash twice daily to see if this helps.  Wash all clothing, sheets and towels in hot water and I would go back to your original laundry detergent to see if this helps with the rash.  If itching develops you can use the cetirizine daily.  Use the steroid cream twice daily for the next week.  If the rash persist without improvement follow-up with primary care provider for further guidance.    ED Prescriptions     Medication Sig Dispense Auth. Provider   triamcinolone  cream (KENALOG ) 0.1 %  (Status: Discontinued) Apply 1 Application topically 2 (two) times daily. 30 g Dreama, Geraldina Parrott  N, FNP   cetirizine (ZYRTEC) 10 MG tablet  (Status: Discontinued) Take 1 tablet (10 mg total) by mouth daily. 30 tablet Dreama, Marvie Brevik  N, FNP   triamcinolone  cream  (KENALOG ) 0.1 % Apply 1 Application topically 2 (two) times daily. 30 g Dreama, Nyeema Want  N, FNP   cetirizine (ZYRTEC) 10 MG tablet Take 1 tablet (10 mg total) by mouth daily. 30 tablet Dreama, Eldine Rencher  N, FNP      PDMP not reviewed this encounter.   Dreama Fartun Paradiso  N, FNP 05/04/24 [phone]

## 2024-05-04 NOTE — ED Triage Notes (Addendum)
 Patient has memory impairment some loss, history during intake (limited).

## 2024-05-04 NOTE — Discharge Instructions (Signed)
 I suggest mixing the steroid cream with Aquaphor and applying this to the rash twice daily to see if this helps.  Wash all clothing, sheets and towels in hot water and I would go back to your original laundry detergent to see if this helps with the rash.  If itching develops you can use the cetirizine daily.  Use the steroid cream twice daily for the next week.  If the rash persist without improvement follow-up with primary care provider for further guidance.

## 2024-06-26 ENCOUNTER — Ambulatory Visit: Admission: EM | Admit: 2024-06-26 | Discharge: 2024-06-26 | Disposition: A | Discharge status: Home / Self Care

## 2024-06-26 ENCOUNTER — Encounter: Payer: Self-pay | Admitting: Emergency Medicine

## 2024-06-26 DIAGNOSIS — R21 Rash and other nonspecific skin eruption: Secondary | ICD-10-CM

## 2024-06-26 MED ORDER — FEXOFENADINE HCL 180 MG PO TABS: 180.0000 mg | ORAL_TABLET | Freq: Every day | ORAL | 0 refills | Status: AC

## 2024-06-26 MED ORDER — METHYLPREDNISOLONE 4 MG PO TBPK: ORAL_TABLET | ORAL | 0 refills | Status: AC

## 2024-06-26 NOTE — ED Triage Notes (Addendum)
 Pt is here with husband who reports rash to back since early Dec. No known causative factors. Husband has changed detergent again in hopes it would help but it has not. No new meds, foods, or insect bites. Pt took cetirizine  dose prescribed on 12/01 with no improvement. Never received kenalog  cream from pharmacy. No other med use for symptoms. Pt has memory impairment. Rash has never been itchy or painful.

## 2024-06-26 NOTE — Discharge Instructions (Signed)
 Will send steroid dose pack and atarax to the pharmacy. If symptoms do not improve please follow-up with dermatology.

## 2024-06-26 NOTE — ED Provider Notes (Signed)
 " [facility]    CSN: [id number] Arrival date & time: 06/26/24  1157      History   Chief Complaint Chief Complaint  Patient presents with   Rash    HPI Gina King is a 79 y.o. female.   Patient presents today due to 5 weeks worth of pruritic, erythematous eruption of back.  Patient states that she was seen previously for the symptoms and was prescribed cetirizine  and steroid cream.  Patient states she was only able to pick up the cetirizine  from the pharmacy.  Patient has been taking the cetirizine  without relief, states that  the eruption has worsened.  The history is provided by the patient.  Rash   Past Medical History:  Diagnosis Date   CVA (cerebral vascular accident)    07/2021 MRI - Moderate chronic microvascular ischemic changes with a few superimposed chronic small vessel infarcts   GERD (gastroesophageal reflux disease) 12/30/2006   Mixed hyperlipidemia 07/28/2007   Moderate dementia, unclear etiology 08/25/2021   Numbness and tingling of left leg 11/30/2020   Osteopenia 12/30/2006   Restless leg syndrome 06/23/2021   Vitamin D  deficiency 07/28/2007    Patient Active Problem List   Diagnosis Date Noted   Essential hypertension 03/24/2022   Moderate dementia without behavioral disturbance, psychotic disturbance, mood disturbance, or anxiety (HCC) 08/25/2021   Hx of completed stroke 07/28/2021   Encounter for general adult medical examination with abnormal findings 02/22/2014   Vitamin D  deficiency 07/28/2007   Mixed hyperlipidemia 07/28/2007   GERD (gastroesophageal reflux disease) 12/30/2006   Osteopenia 12/30/2006    Past Surgical History:  Procedure Laterality Date   ABDOMINAL HYSTERECTOMY  1984    OB History   No obstetric history on file.      Home Medications    Prior to Admission medications  Medication Sig Start Date End Date Taking? Authorizing Provider  cholecalciferol (VITAMIN D ) 1000 UNITS tablet Take 1,000 Units by  mouth daily.   Yes [provider]  donepezil  (ARICEPT ) 10 MG tablet TAKE  1 TABLET EVERY DAY 02/21/24  Yes Wertman, Sara E, PA-C  fexofenadine  (ALLEGRA  ALLERGY) 180 MG tablet Take 1 tablet (180 mg total) by mouth daily. 06/26/24  Yes Andra Corean BROCKS, PA-C  memantine  (NAMENDA ) 10 MG tablet Take 1 tablet (10 mg total) by mouth at bedtime. 02/23/24  Yes Wertman, Sara E, PA-C  methylPREDNISolone  (MEDROL  DOSEPAK) 4 MG TBPK tablet Take as directed on back of package 06/26/24  Yes Andra Corean BROCKS, PA-C  Multiple Vitamin (MULTIVITAMIN) tablet Take 1 tablet by mouth daily.   Yes [provider]  pantoprazole  (PROTONIX ) 20 MG tablet TAKE 1 TABLET EVERY DAY 05/30/23  Yes Rollene Almarie LABOR, MD  rosuvastatin  (CRESTOR ) 20 MG tablet TAKE 1 TABLET EVERY DAY 06/17/23  Yes Rollene Almarie LABOR, MD  vitamin B-12 (CYANOCOBALAMIN ) 1000 MCG tablet Take 1,000 mcg by mouth daily. Reported on 11/07/2015   Yes [provider]  vitamin C (ASCORBIC ACID) 500 MG tablet Take 500 mg by mouth 2 (two) times daily.   Yes [provider]  FLUAD 0.5 ML injection Inject 0.5 mLs into the muscle once. 02/24/24   [provider]  triamcinolone  cream (KENALOG ) 0.1 % Apply 1 Application topically 2 (two) times daily. Patient not taking: Reported on 06/26/2024 05/04/24   Mercer Katherine KANDICE, FNP    Family History Family History  Problem Relation Age of Onset   Memory loss Father        with  advanced age and other medical complications   Memory loss Brother        concerns for dementia presentation   Kidney failure Brother    Colon cancer Neg Hx     Social History Social History[1]   Allergies   Gabapentin   Review of Systems Review of Systems  Skin:  Positive for rash.     Physical Exam Triage Vital Signs ED Triage Vitals [06/26/24 1210]  Encounter Vitals Group     BP 103/75     Girls Systolic BP Percentile      Girls Diastolic BP Percentile      Boys Systolic BP  Percentile      Boys Diastolic BP Percentile      Pulse Rate (!) 102     Resp 14     Temp 98 F (36.7 C)     Temp Source Oral     SpO2 97 %     Weight      Height      Head Circumference      Peak Flow      Pain Score 0     Pain Loc      Pain Education      Exclude from Growth Chart    No data found.  Updated Vital Signs BP 103/75 (BP Location: Left Arm)   Pulse (!) 102   Temp 98 F (36.7 C) (Oral)   Resp 14   SpO2 97%   Visual Acuity Right Eye Distance:   Left Eye Distance:   Bilateral Distance:    Right Eye Near:   Left Eye Near:    Bilateral Near:     Physical Exam Vitals and nursing note reviewed.  Constitutional:      General: She is not in acute distress.    Appearance: Normal appearance. She is not ill-appearing, toxic-appearing or diaphoretic.  Eyes:     General: No scleral icterus. Cardiovascular:     Rate and Rhythm: Normal rate and regular rhythm.     Heart sounds: Normal heart sounds.  Pulmonary:     Effort: Pulmonary effort is normal. No respiratory distress.     Breath sounds: Normal breath sounds. No wheezing or rhonchi.  Skin:    General: Skin is warm.     Findings: Erythema and rash present.     Comments: Erythematous eruption noted of back and abdomen  Neurological:     Mental Status: She is alert and oriented to person, place, and time.  Psychiatric:        Mood and Affect: Mood normal.        Behavior: Behavior normal.      UC Treatments / Results  Labs (all labs ordered are listed, but only abnormal results are displayed) Labs Reviewed - No data to display  EKG   Radiology No results found.  Procedures Procedures (including critical care time)  Medications Ordered in UC Medications - No data to display  Initial Impression / Assessment and Plan / UC Course  I have reviewed the triage vital signs and the nursing notes.  Pertinent labs & imaging results that were available during my care of the patient were reviewed  by me and considered in my medical decision making (see chart for details).     Final Clinical Impressions(s) / UC Diagnoses   Final diagnoses:  Rash and nonspecific skin eruption     Discharge Instructions      Will send steroid dose pack and atarax to  the pharmacy. If symptoms do not improve please follow-up with dermatology.     ED Prescriptions     Medication Sig Dispense Auth. Provider   methylPREDNISolone  (MEDROL  DOSEPAK) 4 MG TBPK tablet Take as directed on back of package 21 tablet Andra Corean BROCKS, PA-C   fexofenadine  (ALLEGRA  ALLERGY) 180 MG tablet Take 1 tablet (180 mg total) by mouth daily. 30 tablet Andra Corean BROCKS, PA-C      PDMP not reviewed this encounter.    [1]  Social History Tobacco Use   Smoking status: Never    Passive exposure: Never   Smokeless tobacco: Never  Vaping Use   Vaping status: Never Used  Substance Use Topics   Alcohol use: No   Drug use: No     Andra Corean BROCKS, PA-C 06/26/24 1336  "

## 2024-08-20 ENCOUNTER — Ambulatory Visit: Admitting: Physician Assistant

## 2024-10-16 ENCOUNTER — Encounter: Admitting: Internal Medicine

## 2024-10-16 ENCOUNTER — Ambulatory Visit
